# Patient Record
Sex: Male | Born: 1945 | Race: White | Hispanic: No | Smoking: Never smoker
Health system: Southern US, Community
[De-identification: ages and names within clinical notes are randomized; demographics above are authoritative.]

## PROBLEM LIST (undated history)

## (undated) DIAGNOSIS — E785 Hyperlipidemia, unspecified: Secondary | ICD-10-CM

## (undated) DIAGNOSIS — R7301 Impaired fasting glucose: Secondary | ICD-10-CM

## (undated) DIAGNOSIS — B159 Hepatitis A without hepatic coma: Secondary | ICD-10-CM

## (undated) DIAGNOSIS — I714 Abdominal aortic aneurysm, without rupture, unspecified: Secondary | ICD-10-CM

## (undated) DIAGNOSIS — I10 Essential (primary) hypertension: Secondary | ICD-10-CM

## (undated) HISTORY — DX: Abdominal aortic aneurysm, without rupture: I71.4

## (undated) HISTORY — DX: Hyperlipidemia, unspecified: E78.5

## (undated) HISTORY — DX: Hepatitis a without hepatic coma: B15.9

## (undated) HISTORY — DX: Essential (primary) hypertension: I10

## (undated) HISTORY — DX: Abdominal aortic aneurysm, without rupture, unspecified: I71.40

## (undated) HISTORY — DX: Impaired fasting glucose: R73.01

---

## 1980-04-03 HISTORY — PX: OTHER SURGICAL HISTORY: SHX169

## 1996-04-03 DIAGNOSIS — B159 Hepatitis A without hepatic coma: Secondary | ICD-10-CM

## 1996-04-03 HISTORY — DX: Hepatitis a without hepatic coma: B15.9

## 2006-02-21 ENCOUNTER — Ambulatory Visit: Payer: Self-pay | Admitting: Family Medicine

## 2006-02-21 DIAGNOSIS — E785 Hyperlipidemia, unspecified: Secondary | ICD-10-CM

## 2006-02-21 DIAGNOSIS — I10 Essential (primary) hypertension: Secondary | ICD-10-CM | POA: Insufficient documentation

## 2006-07-20 ENCOUNTER — Ambulatory Visit: Payer: Self-pay | Admitting: Family Medicine

## 2006-07-23 ENCOUNTER — Encounter: Payer: Self-pay | Admitting: Family Medicine

## 2006-07-23 DIAGNOSIS — R972 Elevated prostate specific antigen [PSA]: Secondary | ICD-10-CM | POA: Insufficient documentation

## 2006-07-23 LAB — CONVERTED CEMR LAB
ALT: 12 units/L (ref 0–53)
Albumin: 4.3 g/dL (ref 3.5–5.2)
BUN: 20 mg/dL (ref 6–23)
Creatinine, Ser: 0.96 mg/dL (ref 0.40–1.50)
Glucose, Bld: 101 mg/dL — ABNORMAL HIGH (ref 70–99)
HDL: 36 mg/dL — ABNORMAL LOW (ref >39)
Potassium: 4.3 meq/L (ref 3.5–5.3)
Sodium: 139 meq/L (ref 135–145)
Total Bilirubin: 0.6 mg/dL (ref 0.3–1.2)
Total CHOL/HDL Ratio: 3.8
Total Protein: 7.2 g/dL (ref 6.0–8.3)
VLDL: 17 mg/dL (ref 0–40)

## 2006-07-26 ENCOUNTER — Encounter: Payer: Self-pay | Admitting: Family Medicine

## 2007-01-23 ENCOUNTER — Ambulatory Visit: Payer: Self-pay | Admitting: Family Medicine

## 2007-02-08 ENCOUNTER — Ambulatory Visit: Payer: Self-pay | Admitting: Family Medicine

## 2007-02-12 ENCOUNTER — Telehealth: Payer: Self-pay | Admitting: Family Medicine

## 2007-02-13 ENCOUNTER — Encounter: Payer: Self-pay | Admitting: Family Medicine

## 2007-03-14 ENCOUNTER — Encounter: Payer: Self-pay | Admitting: Family Medicine

## 2007-03-14 LAB — HM COLONOSCOPY

## 2007-10-31 ENCOUNTER — Telehealth: Payer: Self-pay | Admitting: Family Medicine

## 2007-11-01 ENCOUNTER — Encounter: Payer: Self-pay | Admitting: Family Medicine

## 2007-11-01 ENCOUNTER — Ambulatory Visit: Payer: Self-pay | Admitting: Family Medicine

## 2007-11-01 DIAGNOSIS — I440 Atrioventricular block, first degree: Secondary | ICD-10-CM

## 2007-11-04 LAB — CONVERTED CEMR LAB
ALT: 15 units/L (ref 0–53)
BUN: 18 mg/dL (ref 6–23)
CO2: 25 meq/L (ref 19–32)
Calcium: 9.9 mg/dL (ref 8.4–10.5)
Chloride: 103 meq/L (ref 96–112)
Cholesterol: 156 mg/dL (ref 0–200)
Glucose, Bld: 106 mg/dL — ABNORMAL HIGH (ref 70–99)
LDL Cholesterol: 95 mg/dL (ref 0–99)
PSA: 1.16 ng/mL (ref 0.10–4.00)
Potassium: 4.7 meq/L (ref 3.5–5.3)
Sodium: 139 meq/L (ref 135–145)

## 2007-11-27 ENCOUNTER — Ambulatory Visit: Payer: Self-pay | Admitting: Cardiology

## 2007-11-29 ENCOUNTER — Ambulatory Visit: Payer: Self-pay | Admitting: Family Medicine

## 2007-11-29 DIAGNOSIS — R7301 Impaired fasting glucose: Secondary | ICD-10-CM | POA: Insufficient documentation

## 2007-12-02 LAB — CONVERTED CEMR LAB
ALT: 20 units/L (ref 0–53)
Albumin: 4.9 g/dL (ref 3.5–5.2)
Indirect Bilirubin: 0.8 mg/dL (ref 0.0–0.9)
Total Bilirubin: 1 mg/dL (ref 0.3–1.2)
Total Protein: 8.2 g/dL (ref 6.0–8.3)

## 2007-12-20 ENCOUNTER — Ambulatory Visit: Payer: Self-pay | Admitting: Cardiology

## 2007-12-20 ENCOUNTER — Ambulatory Visit: Payer: Self-pay

## 2008-01-24 ENCOUNTER — Ambulatory Visit: Payer: Self-pay | Admitting: Family Medicine

## 2008-01-24 LAB — CONVERTED CEMR LAB: Blood Glucose, Fasting: 104 mg/dL

## 2008-04-16 ENCOUNTER — Ambulatory Visit: Payer: Self-pay | Admitting: Family Medicine

## 2008-04-16 LAB — CONVERTED CEMR LAB
Ketones, urine, test strip: NEGATIVE
Nitrite: NEGATIVE
Protein, U semiquant: NEGATIVE
Specific Gravity, Urine: 1.01
WBC Urine, dipstick: NEGATIVE

## 2008-10-08 ENCOUNTER — Ambulatory Visit: Payer: Self-pay | Admitting: Family Medicine

## 2008-10-08 LAB — CONVERTED CEMR LAB: Blood Glucose, AC Bkfst: 110 mg/dL

## 2008-10-09 ENCOUNTER — Encounter: Payer: Self-pay | Admitting: Family Medicine

## 2008-10-12 ENCOUNTER — Encounter: Payer: Self-pay | Admitting: Family Medicine

## 2008-11-13 ENCOUNTER — Encounter: Payer: Self-pay | Admitting: Family Medicine

## 2008-11-16 LAB — CONVERTED CEMR LAB
Alkaline Phosphatase: 51 units/L (ref 39–117)
BUN: 17 mg/dL (ref 6–23)
CO2: 23 meq/L (ref 19–32)
Calcium: 9.5 mg/dL (ref 8.4–10.5)
Cholesterol: 142 mg/dL (ref 0–200)
Glucose, Bld: 102 mg/dL — ABNORMAL HIGH (ref 70–99)
Total Bilirubin: 0.7 mg/dL (ref 0.3–1.2)
Total CHOL/HDL Ratio: 3
Total Protein: 7.4 g/dL (ref 6.0–8.3)
Triglycerides: 58 mg/dL (ref <150)

## 2009-07-28 ENCOUNTER — Ambulatory Visit: Payer: Self-pay | Admitting: Family Medicine

## 2009-07-28 DIAGNOSIS — M79609 Pain in unspecified limb: Secondary | ICD-10-CM

## 2009-07-28 DIAGNOSIS — S6990XA Unspecified injury of unspecified wrist, hand and finger(s), initial encounter: Secondary | ICD-10-CM

## 2009-07-28 DIAGNOSIS — S6980XA Other specified injuries of unspecified wrist, hand and finger(s), initial encounter: Secondary | ICD-10-CM

## 2009-10-02 ENCOUNTER — Ambulatory Visit: Payer: Self-pay | Admitting: Family Medicine

## 2009-10-02 DIAGNOSIS — T6391XA Toxic effect of contact with unspecified venomous animal, accidental (unintentional), initial encounter: Secondary | ICD-10-CM | POA: Insufficient documentation

## 2009-12-17 ENCOUNTER — Ambulatory Visit: Payer: Self-pay | Admitting: Family Medicine

## 2009-12-20 LAB — CONVERTED CEMR LAB
ALT: 18 units/L (ref 0–53)
AST: 57 units/L — ABNORMAL HIGH (ref 0–37)
Alkaline Phosphatase: 58 units/L (ref 39–117)
BUN: 11 mg/dL (ref 6–23)
Creatinine, Ser: 0.88 mg/dL (ref 0.40–1.50)
LDL Cholesterol: 95 mg/dL (ref 0–99)
PSA, Free: 0.3 ng/mL
Potassium: 4.1 meq/L (ref 3.5–5.3)
Sodium: 136 meq/L (ref 135–145)
Total Bilirubin: 0.9 mg/dL (ref 0.3–1.2)
Total CHOL/HDL Ratio: 3.5
Triglycerides: 63 mg/dL (ref <150)
VLDL: 13 mg/dL (ref 0–40)

## 2010-01-27 ENCOUNTER — Telehealth (INDEPENDENT_AMBULATORY_CARE_PROVIDER_SITE_OTHER): Payer: Self-pay | Admitting: *Deleted

## 2010-03-02 ENCOUNTER — Telehealth (INDEPENDENT_AMBULATORY_CARE_PROVIDER_SITE_OTHER): Payer: Self-pay | Admitting: *Deleted

## 2010-04-08 ENCOUNTER — Ambulatory Visit
Admission: RE | Admit: 2010-04-08 | Discharge: 2010-04-08 | Payer: Self-pay | Source: Home / Self Care | Attending: Family Medicine | Admitting: Family Medicine

## 2010-05-03 NOTE — Progress Notes (Signed)
Summary: PSA Dx code  Phone Note Call from Patient Call back at [phone]   Caller: Patient Call For: Brad Bars DO Summary of Call: Pt calls and states that he spoke with [facility] and they never received the new diagnosis code for his PSA test that was done. Please call him back at above number. I looked at looks like you faxed it but... Initial call taken by: Kathlene November LPN,  March 02, 2010 12:57 PM  Follow-up for Phone Call        Spoke w/ Irma at lab. She states that she will change CPT code and resubmit to insurance. She states that it was accepted through their system so she didnt see a problem in the future. Tried to call Pt at above # but did not get an answer or VM Follow-up by: Payton Spark CMA,  March 03, 2010 10:13 AM  Additional Follow-up for Phone Call Additional follow up Details #1::        Wife aware of the above Additional Follow-up by: Payton Spark CMA,  March 03, 2010 3:27 PM

## 2010-05-03 NOTE — Assessment & Plan Note (Signed)
Summary: PT FELL: HIT HEAD, LEFT THUMB-SWOLLEN & BRUISED/TJ   Vital Signs:  Patient Profile:   64 Years Old Male CC:      L Thumb pain x today Height:     66 inches Weight:      166 pounds O2 Sat:      100 % O2 treatment:    Room Air Temp:     97.0 degrees F oral Pulse rate:   76 / minute Pulse rhythm:   regular Resp:     18 per minute BP sitting:   109 / 75  (right arm) Cuff size:   regular  Vitals Entered By: Areta Haber CMA (July 28, 2009 5:47 PM)                  Prior Medication List:  LOVASTATIN 40 MG TABS (LOVASTATIN) Take 1 tablet by mouth once a day ASPIRIN 81 MG TBEC (ASPIRIN) Take 1 tablet by mouth once a day LISINOPRIL-HYDROCHLOROTHIAZIDE 20-25 MG TABS (LISINOPRIL-HYDROCHLOROTHIAZIDE) 1 tab by mouth daily SILVADENE 1 % CREA (SILVER SULFADIAZINE) apply two times a day as needed to burn area * ZOSTAVAX admister x 1  Age > 60   Current Allergies: No known allergies History of Present Illness Chief Complaint: L Thumb pain x today History of Present Illness: Patient  has hit his thumb after falling. He has a goose egg  on his forehead after falling. He hurt his L thumb as well. Denies any LOC but after several hours the L thumb paimn progressivelly got worse.  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) AV BLOCK, 1ST DEGREE (ICD-426.11) PREVENTIVE HEALTH CARE (ICD-V70.0) ELEVATED PROSTATE SPECIFIC ANTIGEN (ICD-790.93) HYPERLIPIDEMIA NEC/NOS (ICD-272.4) HYPERTENSION, BENIGN ESSENTIAL (ICD-401.1)   Current Meds LOVASTATIN 40 MG TABS (LOVASTATIN) Take 1 tablet by mouth once a day ASPIRIN 81 MG TBEC (ASPIRIN) Take 1 tablet by mouth once a day LISINOPRIL-HYDROCHLOROTHIAZIDE 20-25 MG TABS (LISINOPRIL-HYDROCHLOROTHIAZIDE) 1 tab by mouth daily MOBIC 7.5 MG TABS (MELOXICAM) sig 1 by mouth q day HYDROCODONE-ACETAMINOPHEN 5-325 MG TABS  (HYDROCODONE-ACETAMINOPHEN) sig 1 by mouth q 6-8hrs as needed for pain  REVIEW OF SYSTEMS Constitutional Symptoms      Denies fever, chills, night sweats, weight loss, weight gain, and fatigue.  Eyes       Denies change in vision, eye pain, eye discharge, glasses, contact lenses, and eye surgery. Ear/Nose/Throat/Mouth       Denies hearing loss/aids, change in hearing, ear pain, ear discharge, dizziness, frequent runny nose, frequent nose bleeds, sinus problems, sore throat, hoarseness, and tooth pain or bleeding.  Respiratory       Denies dry cough, productive cough, wheezing, shortness of breath, asthma, bronchitis, and emphysema/COPD.  Cardiovascular       Denies murmurs, chest pain, and tires easily with exhertion.    Gastrointestinal       Denies stomach pain, nausea/vomiting, diarrhea, constipation, blood in bowel movements, and indigestion. Genitourniary       Denies painful urination, kidney stones, and loss of urinary control. Neurological       Denies paralysis, seizures, and fainting/blackouts. Musculoskeletal       Complains of decreased range of motion, redness, and swelling.      Denies muscle pain, joint pain, joint stiffness, muscle weakness, and gout.      Comments: L thumb x today Skin       Denies bruising, unusual mles/lumps or sores, and hair/skin or nail changes.  Psych       Denies mood changes, temper/anger issues, anxiety/stress, speech problems, depression, and sleep problems. Other Comments: Pt states he was going up stairs and fell, hitting his head and landed on L hand. Pt states his L thumb is hurting.   Past History:  Past Medical History: Last updated: 10/08/2008 Hepatitis A 1998 with elevated LFTs high cholestrol HTN IFG  Stress test normal 09 Colonoscopy normal 08  Past Surgical History: Last updated: 02/21/2006 L knee arthroscopic surgery 1982  Family History: Last updated: 12-Feb-2008 father died 55s of heart dz mother HTN, high chol,  died 62.  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  2 grown children, 4 grandkids.  Nonsmoker.  Walks 45 min a day. Denies ETOH.  Eats healthy. Never Smoked Drug use-yes Regular exercise-yes  Risk Factors: Alcohol Use: <1 (11/01/2007) Caffeine Use: 0 (11/01/2007) Exercise: yes (11/01/2007)  Risk Factors: Smoking Status: never (11/01/2007) Passive Smoke Exposure: no (11/01/2007)  Family History: Reviewed history from 02-12-2008 and no changes required. father died 57s of heart dz mother HTN, high chol, died 59.  sisters and brother HTN, high chol  no fam hx of type II DM  Social History: Reviewed history from 11/01/2007 and no changes required. 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  2 grown children, 4 grandkids.  Nonsmoker.  Walks 45 min a day. Denies ETOH.  Eats healthy. Never Smoked Drug use-yes Regular exercise-yes Physical Exam General appearance: well developed, well nourished, no acute distress Head: R goose egg  Eyes: conjunctivae and lids normal Pupils: equal, round, reactive to light Neck: neck supple,  trachea midline, no masses Extremities: L thumb swollen proxmal phallenx no tenderness over the snuff box Skin: no obvious rashes or lesions MSE: oriented to time, place, and person Assessment New Problems: INJURY OTHER AND UNSPECIFIED FINGER (ICD-959.5) THUMB PAIN, LEFT (ICD-729.5) MUSCULOSKELETAL PAIN (ICD-781.99)  contusion of L thumb  Patient Education: Patient and/or caregiver instructed in the following: rest fluids and Tylenol.  Plan New Medications/Changes: HYDROCODONE-ACETAMINOPHEN 5-325 MG TABS (HYDROCODONE-ACETAMINOPHEN) sig 1 by mouth q 6-8hrs as needed for pain  #16 x 0, 07/28/2009, Hassan Rowan MD MOBIC 7.5 MG TABS (MELOXICAM) sig 1 by mouth q day  #30 x 0, 07/28/2009, Hassan Rowan MD  New Orders: New Patient Level  III [[address]] T-DG Finger Thumb*L* [73140] Planning Comments:   need to follow up w/PCP  Follow Up: Follow up on an as needed basis, Follow up with Primary Physician  The patient and/or caregiver has been counseled thoroughly with regard to medications prescribed including dosage, schedule, interactions, rationale for use, and possible side effects and they verbalize understanding.  Diagnoses and expected course of recovery discussed and will return if not improved as expected or if the condition worsens. Patient and/or caregiver verbalized understanding.  Prescriptions: HYDROCODONE-ACETAMINOPHEN 5-325 MG TABS (HYDROCODONE-ACETAMINOPHEN) sig 1 by mouth q 6-8hrs as needed for pain  #16 x 0   Entered and Authorized by:   Hassan Rowan MD   Signed by:   Hassan Rowan MD on 07/28/2009   Method used:   Printed then faxed to ...       [facility] (retail)       [address]       [place], [place]  [address]       Ph: [phone]       Fax: [phone]   RxID:  [id number] MOBIC 7.5 MG TABS (MELOXICAM) sig 1 by mouth q day  #30 x 0   Entered and Authorized by:   Hassan Rowan MD   Signed by:   Hassan Rowan MD on 07/28/2009   Method used:   Printed then faxed to ...       [facility] (retail)       [address]       [place], [place]  [address]       Ph: [phone]       Fax: [phone]   RxID:   [id number]   Patient Instructions: 1)  Please schedule an appointment with your primary doctor in : 2)  Please schedule a follow-up appointment in 1- 2 weeks. 3)  Recommended remaining out of work until 07/30/2009 4)  use the splint for the thumb if it helps 5)  ice the thumb 20 minutes out of 2 hours as much as possible next 48 hours

## 2010-05-03 NOTE — Progress Notes (Signed)
Summary: Lab diagnosis  Phone Note Call from Patient Call back at Home Phone [phone]   Caller: Patient Call For: Seymour Bars DO Summary of Call: Pt called and LM for a return call in regards to insurance billing.  Called pt @ 2:04pm- LMOM to return call. KJ LPN-- Got a bill from [facility] on PSA test- they are not covering this test because of diagnosis code that was submitted. 401.1  272.4  V76.44 Initial call taken by: Kathlene November LPN,  January 27, 2010 2:05 PM  Follow-up for Phone Call        Pleas see printed new order. Let resubmit adn see if covered. Just changed to the medicare cod.  Follow-up by: Nani Gasser MD,  January 27, 2010 5:41 PM  Additional Follow-up for Phone Call Additional follow up Details #1::        Faxed to lab Additional Follow-up by: Payton Spark CMA,  January 28, 2010 8:24 AM

## 2010-05-03 NOTE — Assessment & Plan Note (Signed)
Summary: CPE   Vital Signs:  Patient profile:   65 year old male Height:      66 inches Weight:      160 pounds BMI:     25.92 O2 Sat:      100 % on Room air Pulse rate:   67 / minute BP sitting:   120 / 82  (left arm) Cuff size:   regular  Vitals Entered By: Payton Spark CMA (December 17, 2009 9:03 AM)  O2 Flow:  Room air CC: CPE and fasting labs   Primary Care Kellyn Mccary:  Seymour Bars DO  CC:  CPE and fasting labs.  History of Present Illness: 65 yo WM presents for CPE.  He has hx HTN, hyperlipidemia and IFG.   He is due for fasting labs.  He had a normal exercise treadmill test with Dr Jens Som 9-09.  Had a normal colonoscopy in 08 and a TDap updated in 07.  Getting flu shot at work.    His brother died recently in 3 wks from CHF and mulitple myeloma.  He was 74.      Current Medications (verified): 1)  Lovastatin 40 Mg Tabs (Lovastatin) .... Take 1 Tablet By Mouth Once A Day 2)  Aspirin 81 Mg Tbec (Aspirin) .... Take 1 Tablet By Mouth Once A Day 3)  Lisinopril-Hydrochlorothiazide 20-25 Mg Tabs (Lisinopril-Hydrochlorothiazide) .Marland Kitchen.. 1 Tab By Mouth Daily  Allergies (verified): No Known Drug Allergies  Past History:  Past Medical History: Reviewed history from 10/08/2008 and no changes required. Hepatitis A 1998 with elevated LFTs high cholestrol HTN IFG  Stress test normal 09 Colonoscopy normal 08  Past Surgical History: Reviewed history from 02/21/2006 and no changes required. L knee arthroscopic surgery 1982  Family History: Reviewed history from 01/24/2008 and no changes required. father died 56s of heart dz mother HTN, high chol, died 61.  sisters and brother HTN, high chol  no fam hx of type II DM  Social History: Reviewed history from 11/01/2007 and no changes required. 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  2 grown children, 4 grandkids.  Nonsmoker.  Walks 45 min a day. Denies ETOH.  Eats healthy. Never Smoked Drug  use-yes Regular exercise-yes  Review of Systems  The patient denies anorexia, fever, weight loss, weight gain, vision loss, decreased hearing, hoarseness, chest pain, syncope, dyspnea on exertion, peripheral edema, prolonged cough, headaches, hemoptysis, abdominal pain, melena, hematochezia, severe indigestion/heartburn, hematuria, incontinence, genital sores, muscle weakness, suspicious skin lesions, transient blindness, difficulty walking, depression, unusual weight change, abnormal bleeding, enlarged lymph nodes, angioedema, breast masses, and testicular masses.    Physical Exam  General:  alert, well-developed, well-nourished, and well-hydrated.   Head:  normocephalic and atraumatic.   Eyes:  pupils equal, pupils round, and pupils reactive to light.  wears glasses Ears:  EACs patent; TMs translucent and gray with good cone of light and bony landmarks.  Nose:  no nasal discharge.   Mouth:  good dentition and pharynx pink and moist.   Neck:  no masses.  no audible carotid bruits Lungs:  Normal respiratory effort, chest expands symmetrically. Lungs are clear to auscultation, no crackles or wheezes. Heart:  Normal rate and regular rhythm. S1 and S2 normal without gallop, murmur, click, rub or other extra sounds. Abdomen:  no AA bruits, soft, non-tender, normal bowel sounds, no distention, no masses, no guarding, no hepatomegaly, and no splenomegaly.   Rectal:  No external abnormalities noted. Normal sphincter tone. No rectal masses or tenderness. hemoccult neg  Prostate:  Prostate gland firm and smooth, no enlargement, nodularity, tenderness, mass, asymmetry or induration. Pulses:  2+ radial and pedal pulses Extremities:  no E/C/C Neurologic:  gait normal.   Skin:  color normal.   Cervical Nodes:  No lymphadenopathy noted Psych:  good eye contact, not anxious appearing, and not depressed appearing.     Impression & Recommendations:  Problem # 1:  PHYSICAL EXAMINATION  (ICD-V70.0) Keeping healthy checklist for men reviewed. BP excellent.  Continue current anti hypertensive. Update fasting labs today -- recheck lipids (on statin) and fasting sugar (hx of IFG). BMI 25= normal. TD UTD, getting flu shot at work. Due for PNX at 64. ETT done 9-09, normal.  Asymptomatic.  REpeat next yr. Colonscopy normal 08. DRE today; PSA today for prostate cancer screening.  Complete Medication List: 1)  Lovastatin 40 Mg Tabs (Lovastatin) .... Take 1 tablet by mouth once a day 2)  Aspirin 81 Mg Tbec (Aspirin) .... Take 1 tablet by mouth once a day 3)  Lisinopril-hydrochlorothiazide 20-25 Mg Tabs (Lisinopril-hydrochlorothiazide) .Marland Kitchen.. 1 tab by mouth daily  Other Orders: T-Comprehensive Metabolic Panel [id number]) T-Lipid Profile [id number]) T-PSA Total ([id number]) Hemoccult Guaiac-1 spec.(in office) ([address])  Patient Instructions: 1)  Stay on current meds. 2)  Update labs today. 3)  Will call you w/ results Monday. 4)  Return for f/u BP/ sugar in 4 mos. Prescriptions: LISINOPRIL-HYDROCHLOROTHIAZIDE 20-25 MG TABS (LISINOPRIL-HYDROCHLOROTHIAZIDE) 1 tab by mouth daily  #90 x 3   Entered by:   Payton Spark CMA   Authorized by:   Seymour Bars DO   Signed by:   Seymour Bars DO on 12/17/2009   Method used:   Electronically to        [facility] (retail)             ,          Ph: [phone]       Fax: [phone]   RxID:   [id number] LOVASTATIN 40 MG TABS (LOVASTATIN) Take 1 tablet by mouth once a day  #90 x 3   Entered by:   Payton Spark CMA   Authorized by:   Seymour Bars DO   Signed by:   Seymour Bars DO on 12/17/2009   Method used:   Electronically to        [facility] (retail)             ,          Ph: [phone]       Fax: [phone]   RxID:   [id number]

## 2010-05-03 NOTE — Assessment & Plan Note (Signed)
Summary: R eye pain- stung by yellow jacket x 2 dys rm 3   Vital Signs:  Patient Profile:   65 Years Old Male CC:      R eye pain/stung by yellow jacket x 2 dys Height:     66 inches Weight:      162 pounds O2 Sat:      100 % O2 treatment:    Room Air Temp:     97.6 degrees F oral Pulse rate:   83 / minute Pulse rhythm:   regular Resp:     16 per minute BP sitting:   100 / 68  (right arm) Cuff size:   regular  Vitals Entered By: Areta Haber CMA (October 02, 2009 1:12 PM)                  Current Allergies: No known allergies History of Present Illness Chief Complaint: R eye pain/stung by yellow jacket x 2 dys History of Present Illness:  Subjective:  Patient complains of insect sting to his right face just below eye two days ago.  He has had a gradual increase in swelling around the eye, and there has been persistent itching.  The area has become more sensitive over the past 24 hours.  No change in vision.  Current Meds LOVASTATIN 40 MG TABS (LOVASTATIN) Take 1 tablet by mouth once a day ASPIRIN 81 MG TBEC (ASPIRIN) Take 1 tablet by mouth once a day LISINOPRIL-HYDROCHLOROTHIAZIDE 20-25 MG TABS (LISINOPRIL-HYDROCHLOROTHIAZIDE) 1 tab by mouth daily BENADRYL 50 MG/ML SOLN (DIPHENHYDRAMINE HCL) as directed CEPHALEXIN 500 MG CAPS (CEPHALEXIN) One by mouth two times a day PREDNISONE 10 MG TABS (PREDNISONE) 2 PO BID for 2 days, then 1 BID for 2 days, then 1 daily for 2 days.  Take PC  REVIEW OF SYSTEMS Constitutional Symptoms      Denies fever, chills, night sweats, weight loss, weight gain, and fatigue.  Eyes       Complains of eye pain.      Denies change in vision, eye discharge, glasses, contact lenses, and eye surgery.      Comments: R-stung by yellow jacket x 2 dys Ear/Nose/Throat/Mouth       Denies hearing loss/aids, change in hearing, ear pain, ear discharge, dizziness, frequent runny nose, frequent nose bleeds, sinus problems, sore throat, hoarseness, and tooth  pain or bleeding.  Respiratory       Denies dry cough, productive cough, wheezing, shortness of breath, asthma, bronchitis, and emphysema/COPD.  Cardiovascular       Denies murmurs, chest pain, and tires easily with exhertion.    Gastrointestinal       Denies stomach pain, nausea/vomiting, diarrhea, constipation, blood in bowel movements, and indigestion. Genitourniary       Denies painful urination, kidney stones, and loss of urinary control. Neurological       Denies paralysis, seizures, and fainting/blackouts. Musculoskeletal       Denies muscle pain, joint pain, joint stiffness, decreased range of motion, redness, swelling, muscle weakness, and gout.  Skin       Denies bruising, unusual mles/lumps or sores, and hair/skin or nail changes.  Psych       Denies mood changes, temper/anger issues, anxiety/stress, speech problems, depression, and sleep problems. Other Comments: Pt has not seen PCP for this.    Objective:  No acute distress  Eyes:  Pupils are equal, round, and reactive to light and accomdation.  Extraocular movement is intact.  Conjunctivae are not inflamed.  There is mild edema and erythema of the right upper and lower lids, but no tenderness to palpation.  No drainage from the right eye. 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 EARLY CELLULITIS  Plan New Medications/Changes: PREDNISONE 10 MG TABS (PREDNISONE) 2 PO BID for 2 days, then 1 BID for 2 days, then 1 daily for 2 days.  Take PC  #14 x 0, 10/02/2009, Donna Christen MD CEPHALEXIN 500 MG CAPS (CEPHALEXIN) One by mouth two times a day  #14 x 0, 10/02/2009, Donna Christen MD  New Orders: Est. Patient Level III [phone] Planning Comments:   Tapering course of prednisone.  Begin Keflex.  Apply cool compresses.  Take Benadryl 50mg  hs  Follow-up with ophthalmologist if not improving 3 to 4 days, or if symptoms worsen.   The patient and/or caregiver has been counseled thoroughly with regard to  medications prescribed including dosage, schedule, interactions, rationale for use, and possible side effects and they verbalize understanding.  Diagnoses and expected course of recovery discussed and will return if not improved as expected or if the condition worsens. Patient and/or caregiver verbalized understanding.  Prescriptions: PREDNISONE 10 MG TABS (PREDNISONE) 2 PO BID for 2 days, then 1 BID for 2 days, then 1 daily for 2 days.  Take PC  #14 x 0   Entered and Authorized by:   Donna Christen MD   Signed by:   Donna Christen MD on 10/02/2009   Method used:   Print then Give to Patient   RxID:   [id number] CEPHALEXIN 500 MG CAPS (CEPHALEXIN) One by mouth two times a day  #14 x 0   Entered and Authorized by:   Donna Christen MD   Signed by:   Donna Christen MD on 10/02/2009   Method used:   Print then Give to Patient   RxID:   [id number]   Orders Added: 1)  Est. Patient Level III [29528]

## 2010-05-03 NOTE — Letter (Signed)
Summary: Out of Work  [facility]  [address]   [place], [place] [address]   Phone: [phone]  Fax: [phone]    July 28, 2009   Employee:  BALDEMAR DADY [employer]    To Whom It May Concern:   For Medical reasons, please excuse the above named employee from work for the following dates:  Start:   07/28/2009  Return:   07/30/2009  If you need additional information, please feel free to contact our office.         Sincerely,    Hassan Rowan MD

## 2010-05-05 NOTE — Assessment & Plan Note (Signed)
Summary: sinusitis   Vital Signs:  Patient profile:   65 year old male Height:      66 inches Weight:      168 pounds BMI:     27.21 O2 Sat:      96 % on Room air Temp:     97.4 degrees F oral Pulse rate:   85 / minute BP sitting:   129 / 85  (left arm) Cuff size:   regular  Vitals Entered By: Payton Spark CMA (April 08, 2010 11:32 AM)  O2 Flow:  Room air CC: HA, cough and sinus pressure x weeks. OTC meds not helping   Primary Care Provider:  Seymour Bars DO  CC:  HA and cough and sinus pressure x weeks. OTC meds not helping.  History of Present Illness: 65 yo WM presents for head congestion, cough which started as a cold before the Holidays.  He seemed to get better 3-4 days but now feels worse again.  He has tried [place] HBP and nasal saline which is not helping.  Not taking anyhting for the HA.  No sore throat or ear pain.  Has had subjective fevers.      Current Medications (verified): 1)  Lovastatin 40 Mg Tabs (Lovastatin) .... Take 1 Tablet By Mouth Once A Day 2)  Aspirin 81 Mg Tbec (Aspirin) .... Take 1 Tablet By Mouth Once A Day 3)  Lisinopril-Hydrochlorothiazide 20-25 Mg Tabs (Lisinopril-Hydrochlorothiazide) .Marland Kitchen.. 1 Tab By Mouth Daily  Allergies (verified): No Known Drug Allergies  Past History:  Past Medical History: Reviewed history from 10/08/2008 and no changes required. Hepatitis A 1998 with elevated LFTs high cholestrol HTN IFG  Stress test normal 09 Colonoscopy normal 08  Social History: Reviewed history from 11/01/2007 and no changes required. 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  2 grown children, 4 grandkids.  Nonsmoker.  Walks 45 min a day. Denies ETOH.  Eats healthy. Never Smoked Drug use-yes Regular exercise-yes  Review of Systems      See HPI  Physical Exam  General:  alert, well-developed, well-nourished, and well-hydrated.   Head:  normocephalic and atraumatic.  frontal sinus TTP Eyes:  conjuntiva clear; wears  glasses Ears:  EACs patent; TMs translucent and gray with good cone of light and bony landmarks.  Nose:  nasal congestion present Mouth:  pharynx pink and moist.   Neck:  no masses.   Lungs:  Normal respiratory effort, chest expands symmetrically. Lungs are clear to auscultation, no crackles or wheezes. Heart:  Normal rate and regular rhythm. S1 and S2 normal without gallop, murmur, click, rub or other extra sounds. Skin:  color normal.   Cervical Nodes:  No lymphadenopathy noted   Impression & Recommendations:  Problem # 1:  ACUTE SINUSITIS, UNSPECIFIED (ICD-461.9) Treat with Amoxicillin + Flonase.  OK to use plain Mucinex and Ibuprofen for adjunctive care. Call if not improved after 10 days. His updated medication list for this problem includes:    Amoxicillin 500 Mg Caps (Amoxicillin) .Marland Kitchen... 1 capsule by mouth three times a day x 10 days    Fluticasone Propionate 50 Mcg/act Susp (Fluticasone propionate) .Marland Kitchen... 2 sprays/ nostril once daily  Complete Medication List: 1)  Lovastatin 40 Mg Tabs (Lovastatin) .... Take 1 tablet by mouth once a day 2)  Aspirin 81 Mg Tbec (Aspirin) .... Take 1 tablet by mouth once a day 3)  Lisinopril-hydrochlorothiazide 20-25 Mg Tabs (Lisinopril-hydrochlorothiazide) .Marland Kitchen.. 1 tab by mouth daily 4)  Amoxicillin 500 Mg Caps (Amoxicillin) .Marland KitchenMarland KitchenMarland Kitchen  1 capsule by mouth three times a day x 10 days 5)  Fluticasone Propionate 50 Mcg/act Susp (Fluticasone propionate) .... 2 sprays/ nostril once daily  Patient Instructions: 1)  For sinusitis: 2)  Take Amoxicillin 3 x a day for 10 days (with food). 3)  Use Flonase once daily until bottle is complete. 4)  OK to use Ibuprofen  up to 3 tabs 3 x a day with food for headache and plain Mucinex (to thin out mucous). 5)  Call if not improved after 10 days. Prescriptions: FLUTICASONE PROPIONATE 50 MCG/ACT SUSP (FLUTICASONE PROPIONATE) 2 sprays/ nostril once daily  #1 bottle x 0   Entered and Authorized by:   Seymour Bars DO    Signed by:   Seymour Bars DO on 04/08/2010   Method used:   Electronically to        [facility] (retail)       [address]       [place], [place]  [address]       Ph: [phone]       Fax: [phone]   RxID:   [id number] AMOXICILLIN 500 MG CAPS (AMOXICILLIN) 1 capsule by mouth three times a day x 10 days  #30 x 0   Entered and Authorized by:   Seymour Bars DO   Signed by:   Seymour Bars DO on 04/08/2010   Method used:   Electronically to        [facility] (retail)       [address]       [place], [place]  [address]       Ph: [phone]       Fax: [phone]   RxID:   [id number]    Orders Added: 1)  Est. Patient Level III [06301]

## 2010-05-11 ENCOUNTER — Encounter: Payer: Self-pay | Admitting: Family Medicine

## 2010-05-11 ENCOUNTER — Ambulatory Visit: Payer: Self-pay | Admitting: Family Medicine

## 2010-05-11 ENCOUNTER — Ambulatory Visit (INDEPENDENT_AMBULATORY_CARE_PROVIDER_SITE_OTHER): Payer: BC Managed Care – PPO | Admitting: Family Medicine

## 2010-05-11 DIAGNOSIS — M25519 Pain in unspecified shoulder: Secondary | ICD-10-CM

## 2010-05-11 DIAGNOSIS — IMO0002 Reserved for concepts with insufficient information to code with codable children: Secondary | ICD-10-CM

## 2010-05-18 ENCOUNTER — Ambulatory Visit (INDEPENDENT_AMBULATORY_CARE_PROVIDER_SITE_OTHER): Payer: BC Managed Care – PPO | Admitting: Family Medicine

## 2010-05-18 ENCOUNTER — Encounter: Payer: Self-pay | Admitting: Family Medicine

## 2010-05-18 ENCOUNTER — Other Ambulatory Visit: Payer: Self-pay | Admitting: Family Medicine

## 2010-05-18 ENCOUNTER — Ambulatory Visit
Admission: RE | Admit: 2010-05-18 | Discharge: 2010-05-18 | Disposition: A | Discharge status: Home / Self Care | Payer: BC Managed Care – PPO | Source: Ambulatory Visit | Attending: Family Medicine | Admitting: Family Medicine

## 2010-05-18 DIAGNOSIS — M79609 Pain in unspecified limb: Secondary | ICD-10-CM

## 2010-05-18 DIAGNOSIS — M79601 Pain in right arm: Secondary | ICD-10-CM

## 2010-05-18 DIAGNOSIS — M542 Cervicalgia: Secondary | ICD-10-CM | POA: Insufficient documentation

## 2010-05-19 NOTE — Assessment & Plan Note (Signed)
Summary: R shoulder pain/ abrasions   Vital Signs:  Patient profile:   65 year old male Height:      66 inches Weight:      166 pounds BMI:     26.89 O2 Sat:      99 % on Room air Pulse rate:   71 / minute BP sitting:   126 / 80  (left arm) Cuff size:   regular  Vitals Entered By: Payton Spark CMA (May 11, 2010 10:21 AM)  O2 Flow:  Room air CC: Fell in shower on 05/07/2010 injuring R shoulder and R knee. Also has some lacerations on face and head.    Primary Care Provider:  Seymour Bars DO  CC:  Larey Seat in shower on 05/07/2010 injuring R shoulder and R knee. Also has some lacerations on face and head. Marland Kitchen  History of Present Illness: 65 yo WM presents for f/u after a fall on 2-4.  He was up that night after going to a [employer] and eating a hot dog.  Later that night, he ate chips and hot salsa and had a beer and he felt ill that night.  He was laying on the bathroom floor and then realized he had to have a bowel movement.  When he went to get up rapidly, he smashed his head on the wall and then hit his face above his L eyebrow and his nose on the commode.  He thinks that he tried to brace his fall with his R arm and R leg b/c now his R shoulder and R knee hurt.   He denies LOC.  In fact, he did get back up right away to sit on the commode and had a large BM which helped his abd distress.  He realized that his face was bleeding from the facial lacerations but he was able to stop the bleeding in about 30 min with pressure.  He had a HA for 2 days which did improve with taking Advil.  His R shoulder mainly hurts now.  Denies neck pain, confusion or dizziness.      Current Medications (verified): 1)  Lovastatin 40 Mg Tabs (Lovastatin) .... Take 1 Tablet By Mouth Once A Day 2)  Aspirin 81 Mg Tbec (Aspirin) .... Take 1 Tablet By Mouth Once A Day 3)  Lisinopril-Hydrochlorothiazide 20-25 Mg Tabs (Lisinopril-Hydrochlorothiazide) .Marland Kitchen.. 1 Tab By Mouth Daily 4)  Fluticasone Propionate 50  Mcg/act Susp (Fluticasone Propionate) .... 2 Sprays/ Nostril Once Daily  Allergies (verified): No Known Drug Allergies  Past History:  Past Medical History: Reviewed history from 10/08/2008 and no changes required. Hepatitis A 1998 with elevated LFTs high cholestrol HTN IFG  Stress test normal 09 Colonoscopy normal 08  Social History: Reviewed history from 11/01/2007 and no changes required. 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  2 grown children, 4 grandkids.  Nonsmoker.  Walks 45 min a day. Denies ETOH.  Eats healthy. Never Smoked Drug use-yes Regular exercise-yes  Review of Systems      See HPI  Physical Exam  General:  alert, well-developed, well-nourished, and well-hydrated.   Head:  normocephalic ; mild bruising around nose with small lac, healing lac on top of head and over the L eyebrow Eyes:  conjunctiva clear Nose:  no nasal discharge.   Mouth:  pharynx pink and moist.   Neck:  full ROM and no masses.   Lungs:  Normal respiratory effort, chest expands symmetrically. Lungs are clear to auscultation, no crackles or wheezes.  Heart:  Normal rate and regular rhythm. S1 and S2 normal without gallop, murmur, click, rub or other extra sounds. Msk:  full C spine ROM.  full active R glenohumeral ROM, R elbow and R wrist and R knee ROM.  No joint effusions, asymetry, bruising or redness.  + empty can, Hawkins test and apprehension Pulses:  2+ radial pulses Extremities:  no LE or UE edema Neurologic:  grip + 5/5 bilat Psych:  good eye contact, not anxious appearing, and not depressed appearing.     Impression & Recommendations:  Problem # 1:  SHOULDER PAIN, RIGHT (ICD-719.41) Assessment New Exam is c/w R shoulder strain from fall.  Will treat with ice and Celebrex x 10 days.  Call if not improved after 10 days. His updated medication list for this problem includes:    Aspirin 81 Mg Tbec (Aspirin) .Marland Kitchen... Take 1 tablet by mouth once a day    Celebrex 200 Mg  Caps (Celecoxib) .Marland Kitchen... 1 capsule by mouth two times a day with food for shoulder pain  Problem # 2:  ABRASION/FRICION BURN OTH MX&UNS SITE W/O INF (ICD-919.0) Assessment: New Healing.  Clean with soap and water and cover with abx ointment 1-2 x a day. Luckily, no sign of concussion.  Complete Medication List: 1)  Lovastatin 40 Mg Tabs (Lovastatin) .... Take 1 tablet by mouth once a day 2)  Aspirin 81 Mg Tbec (Aspirin) .... Take 1 tablet by mouth once a day 3)  Lisinopril-hydrochlorothiazide 20-25 Mg Tabs (Lisinopril-hydrochlorothiazide) .Marland Kitchen.. 1 tab by mouth daily 4)  Fluticasone Propionate 50 Mcg/act Susp (Fluticasone propionate) .... 2 sprays/ nostril once daily 5)  Celebrex 200 Mg Caps (Celecoxib) .Marland Kitchen.. 1 capsule by mouth two times a day with food for shoulder pain  Patient Instructions: 1)  Start Celebrex 2 x a day - with breakfast and dinner for remaining inflammation in the R shoulder/ head. 2)  Keep wounds on face clean with soap and water and apply antibiotic ointment 1-2 x a day. 3)  Use ice pack on R shoulder x 10 min after work each day. 4)  Call if R shoulder pain is not improved after 10 days.   Orders Added: 1)  Est. Patient Level IV [[address]]

## 2010-05-25 NOTE — Assessment & Plan Note (Signed)
Summary: On-going Arm pain-jr   Vital Signs:  Patient profile:   65 year old male Height:      66 inches Weight:      165 pounds BMI:     26.73 O2 Sat:      96 % on Room air Pulse rate:   75 / minute BP sitting:   118 / 85  (left arm) Cuff size:   large  Vitals Entered By: Payton Spark CMA (May 18, 2010 3:38 PM)  O2 Flow:  Room air CC: R arm pain from elbow to wrist   Primary Care Provider:  Seymour Bars DO  CC:  R arm pain from elbow to wrist.  History of Present Illness: 65 yo WM presents for R forearm pain but no elbow pain.  He has tingling and numbness into the hand.  R shoulder pain has imroved and he is not having neck pain.  This all started after the fall in the [place].  He denies HAs or back pain.  He has some tingling and numbness in the R forearm and hand, radial side w/o weakness.  Able to function well.  Denies pain in the wrist or elbow.      Current Medications (verified): 1)  Lovastatin 40 Mg Tabs (Lovastatin) .... Take 1 Tablet By Mouth Once A Day 2)  Aspirin 81 Mg Tbec (Aspirin) .... Take 1 Tablet By Mouth Once A Day 3)  Lisinopril-Hydrochlorothiazide 20-25 Mg Tabs (Lisinopril-Hydrochlorothiazide) .Marland Kitchen.. 1 Tab By Mouth Daily 4)  Fluticasone Propionate 50 Mcg/act Susp (Fluticasone Propionate) .... 2 Sprays/ Nostril Once Daily 5)  Celebrex 200 Mg Caps (Celecoxib) .Marland Kitchen.. 1 Capsule By Mouth Two Times A Day With Food For Shoulder Pain  Allergies (verified): No Known Drug Allergies  Review of Systems      See HPI  Physical Exam  General:  alert, well-developed, well-nourished, and well-hydrated.   Msk:  C spine limited in full rotation and SB bilat, full flexion, tight trapezious muscles with full glenohumeral, R elbow and wrist ROM w/o pain.  no forearm tenderness to squeeze, bruising, redness or joint effusion. Pulses:  2+ R ulnar and radial pulse Extremities:  no UE edema Neurologic:  grip + 5/5 bilat, sensation intact to light touch.   Skin:   color normal.   Cervical Nodes:  No lymphadenopathy noted   Impression & Recommendations:  Problem # 1:  ARM PAIN, RIGHT (ICD-729.5) By hx and exam, this seems to be a radiculopathy. likely coming from his neck, triggered by inflammation from recent fall.  Celebrex helped but he was walking up at night withpain and tingling.  Stop and replace with Medrol Dose pack for day and Flexeril for night.  F/u neck xray results. Orders: T-DG Cervical Spine 2-3 Views [id number])  Complete Medication List: 1)  Lovastatin 40 Mg Tabs (Lovastatin) .... Take 1 tablet by mouth once a day 2)  Aspirin 81 Mg Tbec (Aspirin) .... Take 1 tablet by mouth once a day 3)  Lisinopril-hydrochlorothiazide 20-25 Mg Tabs (Lisinopril-hydrochlorothiazide) .Marland Kitchen.. 1 tab by mouth daily 4)  Fluticasone Propionate 50 Mcg/act Susp (Fluticasone propionate) .... 2 sprays/ nostril once daily 5)  Medrol (pak) 4 Mg Tabs (Methylprednisolone) .... Take as directed 6)  Cyclobenzaprine Hcl 5 Mg Tabs (Cyclobenzaprine hcl) .Marland Kitchen.. 1-2 tabs by mouth at bedtime x 7 days  Patient Instructions: 1)  Stop Celebrex. 2)  Start Medrol Dose Pack and take all of it to reduce inflammation. 3)  Use Cyclobenzaprin (muscle relaxer) at night x 1  wk. 4)  Xray neck today. 5)  Will call you w/ results tomorrow. 6)  Call if not improving in 10 days. Prescriptions: CYCLOBENZAPRINE HCL 5 MG TABS (CYCLOBENZAPRINE HCL) 1-2 tabs by mouth at bedtime x 7 days  #14 x 0   Entered and Authorized by:   Seymour Bars DO   Signed by:   Seymour Bars DO on 05/18/2010   Method used:   Electronically to        [facility] (retail)       [address]       [place], [place]  [address]       Ph: [phone]       Fax: [phone]   RxID:   [id number] MEDROL (PAK) 4 MG TABS (METHYLPREDNISOLONE) take as directed  #1 pack x 0   Entered and Authorized by:   Seymour Bars DO   Signed by:   Seymour Bars DO on 05/18/2010   Method used:   Electronically to        [facility] (retail)        [address]       [place], [place]  [address]       Ph: [phone]       Fax: [phone]   RxID:   [id number]    Orders Added: 1)  T-DG Cervical Spine 2-3 Views [[address]] 2)  Est. Patient Level III [009[address]]

## 2010-05-27 ENCOUNTER — Encounter: Payer: Self-pay | Admitting: Family Medicine

## 2010-05-27 ENCOUNTER — Ambulatory Visit (INDEPENDENT_AMBULATORY_CARE_PROVIDER_SITE_OTHER): Payer: BC Managed Care – PPO | Admitting: Family Medicine

## 2010-05-27 DIAGNOSIS — G540 Brachial plexus disorders: Secondary | ICD-10-CM | POA: Insufficient documentation

## 2010-05-31 NOTE — Assessment & Plan Note (Signed)
Summary: TOS   Vital Signs:  Patient profile:   65 year old male Height:      66 inches (167.64 cm) Weight:      163.75 pounds (74.43 kg) BMI:     26.53 O2 Sat:      95 % on Room air Temp:     97.5 degrees F (36.39 degrees C) oral Pulse rate:   77 / minute BP sitting:   113 / 74  (right arm) Cuff size:   regular  Vitals Entered By: Lucious Groves CMA (May 27, 2010 11:20 AM)  O2 Flow:  Room air CC: F/U arm and shoulder pain./kb Is Patient Diabetic? No Pain Assessment Patient in pain? yes     Location: arm Intensity: 2 Type: aching Onset of pain  continued from previous Comments Patient ntoes that his pain is mostly of the arm now. Patient is currently not using Fluticasone.   Primary Care Provider:  Seymour Bars DO  CC:  F/U arm and shoulder pain./kb.  History of Present Illness: 65 yo WM presents for f/u R arm pain and numbnesness and tingling.  He noticed a big improvement with the Medrol Dose Pak  which really helped but not that he is off, his pain is worse esp at night.  Denies any functional weakness at home or at work.  He has maintaintained full ROM of his neck and shoulder.  On XRAY, he was found to have bilat cervical ribs which makes the diagnosis of Thoracic Outlet syndrome very likely.  Flexeril not helping.  Current Medications (verified): 1)  Lovastatin 40 Mg Tabs (Lovastatin) .... Take 1 Tablet By Mouth Once A Day 2)  Aspirin 81 Mg Tbec (Aspirin) .... Take 1 Tablet By Mouth Once A Day 3)  Lisinopril-Hydrochlorothiazide 20-25 Mg Tabs (Lisinopril-Hydrochlorothiazide) .Marland Kitchen.. 1 Tab By Mouth Daily 4)  Fluticasone Propionate 50 Mcg/act Susp (Fluticasone Propionate) .... 2 Sprays/ Nostril Once Daily  Allergies (verified): No Known Drug Allergies  Past History:  Past Medical History: Reviewed history from 10/08/2008 and no changes required. Hepatitis A 1998 with elevated LFTs high cholestrol HTN IFG  Stress test normal 09 Colonoscopy normal  08  Review of Systems      See HPI  Physical Exam  General:  alert, well-developed, well-nourished, and well-hydrated.   Head:  normocephalic and atraumatic.   Msk:  full C spine  and R glenohumeral ROM w/o pain.  slight tenderness to palpation under middle R clavicle and with shoulder adduction and external rotation Pulses:  2+ radial and ulnar pulses Extremities:  no UE or LE edema Neurologic:  sensation intact to light touch.  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 Improved some with Medrol Dose pack but pain has returned off the meds.  Will replace with naproxen 500 mg two times a day with food and set him up with ortho.  He will likely need to start PT.  Reviewed findings with pt today including showing him his XRAYs.  Given info h/o on TOS. Orders: Orthopedic Surgeon Referral (Ortho Surgeon)  Complete Medication List: 1)  Lovastatin 40 Mg Tabs (Lovastatin) .... Take 1 tablet by mouth once a day 2)  Lisinopril-hydrochlorothiazide 20-25 Mg Tabs (Lisinopril-hydrochlorothiazide) .Marland Kitchen.. 1 tab by mouth daily 3)  Fluticasone Propionate 50 Mcg/act Susp (Fluticasone propionate) .... 2 sprays/ nostril once daily 4)  Naproxen 500 Mg Tabs (  Naproxen) .Marland Kitchen.. 1 tab by mouth two times a day with breakfast and dinner  Patient Instructions: 1)  Start Naproxen as your anti - inflammatory 2 x a day wtih food.   2)  Stop Aspirin while you are on this. 3)  REferral made to [facility].   Prescriptions: NAPROXEN 500 MG TABS (NAPROXEN) 1 tab by mouth two times a day with breakfast and dinner  #60 x 0   Entered and Authorized by:   Seymour Bars DO   Signed by:   Seymour Bars DO on 05/27/2010   Method used:   Electronically to        [facility] (retail)       [address]       [place], [place]  [address]       Ph: [phone]       Fax:  [phone]   RxID:   [id number]    Orders Added: 1)  Orthopedic Surgeon Referral [Ortho Surgeon] 2)  Est. Patient Level III [84[address]]

## 2010-06-02 ENCOUNTER — Telehealth: Payer: Self-pay | Admitting: Family Medicine

## 2010-06-09 NOTE — Progress Notes (Signed)
Summary: Checking referral status  Phone Note Call from Patient   Caller: Patient Summary of Call: Patient called wanting to check the status on his referral to ortho at [facility] and I informed him that Legrand Rams was working on this referral and would be calling him with appt info... patient would like a call back at [phone] with his appt info... Thanks.Michaelle Copas  June 02, 2010 9:27 AM  Initial call taken by: Michaelle Copas,  June 02, 2010 9:27 AM  Follow-up for Phone Call        SW pt, appt has been established at [facility] 06/06/10 11:00, called pt back & gave info Follow-up by: Lannette Donath,  June 02, 2010 10:01 AM

## 2010-06-16 ENCOUNTER — Telehealth (INDEPENDENT_AMBULATORY_CARE_PROVIDER_SITE_OTHER): Payer: Self-pay | Admitting: *Deleted

## 2010-06-30 NOTE — Progress Notes (Signed)
Summary: RC  Phone Note Call from Patient   Caller: Patient Summary of Call: Pt LMOM asking for Dr. Cathey Endow to call him to discuss naproxen and arm pain. Please call Pt at [phone] Initial call taken by: Payton Spark CMA,  June 16, 2010 3:03 PM  Follow-up for Phone Call        Pls call pt back to clarify.  I have that he is taking RX Naproxen 2 x a day with food for pain.  Let me know if any specific questions.  I did see that he saw ortho. Follow-up by: Seymour Bars DO,  June 16, 2010 6:11 PM  Additional Follow-up for Phone Call Additional follow up Details #1::        [facility] for Pt to CB if still has questions.  Additional Follow-up by: Payton Spark CMA,  June 20, 2010 9:03 AM

## 2010-08-16 NOTE — Assessment & Plan Note (Signed)
[facility]                            CARDIOLOGY OFFICE NOTE   NAME:THIBEAULTDaryus, Brad Cannon                   MRN:          [id number]  DATE:11/27/2007                            DOB:          22-Aug-1945    [name] is a 65 year old male with past medical history of  hypertension, hyperlipidemia, who I am asked to evaluate for an abnormal  electrocardiogram.  He has no prior cardiac history.  Note, he does not  have dyspnea on exertion, orthopnea, PND, pedal edema, palpitations,  presyncope, syncope, or exertional chest pain.  He can walk up to 1 hour  at a time without having any symptoms.  He was recently seen for a  routine physical examination and was found to have an abnormal  electrocardiogram and we were asked to further evaluate.   MEDICATIONS:  His present medications include;  1. Lovastatin 40 mg p.o. daily.  2. Aspirin 81 mg p.o. daily.  3. Lisinopril HCT 20/25 daily.   He has known drug allergies.   SOCIAL HISTORY:  He does not smoke.  He rarely consumes alcohol.   His family history is negative for coronary disease in his immediate  family.  His father did have previous CVA.   His past medical history, there is no diabetes mellitus, but there is  hypertension and hyperlipidemia.  He has a history of nephrolithiasis.  He has had prior knee surgery on a knee tendon.   His review of systems, he denies any headaches, fevers, or chills.  There is no productive cough or hemoptysis.  There is no dysphagia,  odynophagia, melena, or hematochezia.  There is no dysuria or hematuria.  There is no rashes or seizure activity.  There is no orthopnea, PND, or  pedal edema.  Remaining systems are negative.   PHYSICAL EXAMINATION:  VITAL SIGNS:  Shows a blood pressure of 126/81  and his pulse is 82.  Weighs is 180 pounds.  GENERAL:  He is well-developed, well-nourished, in no acute distress.  SKIN:  Warm and dry.  He does not appear to be  depressed.  There is no  peripheral clubbing.  BACK:  Normal.  HEENT:  Normal eyelids.  NECK:  Supple with a normal upstroke bilaterally.  No bruits noted.  There is no jugular distention.  I cannot appreciate thyromegaly.  CHEST:  Clear to auscultation.  No expansion.  CARDIOVASCULAR:  Regular rate and rhythm.  Normal S1 and S2.  No  murmurs, rubs, or gallops noted.  ABDOMEN:  Nontender.  Distended positive bowel sounds.  No  hepatosplenomegaly, no mass appreciated.  There is no abdominal bruit.  He has 2+ femoral pulses bilaterally.  No bruits.  EXTREMITIES:  Showed no edema and I could palpate no cords.  He has 2+  dorsalis pedis pulses bilaterally.  NEURO:  Grossly intact.  I do have an electrocardiogram dated November 01, 2007.  This is from Dr. Ovidio Cannon office.  This showed a sinus rhythm with  a first-degree AV block.  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  There were no ST changes noted.  DIAGNOSES:  1. 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  However, he has no      cardiac symptoms including no syncope.  He does have risk factors      of hypertension as well as hyperlipidemia.  He is also      contemplating increasing his exercise program to include      racquetball.  We will plan to proceed with an exercise treadmill.      If it is low-risk, then we will not pursue further cardiac      evaluation.  2. Hypertension - His blood pressure is adequately controlled on his      present medications.  3. Hyperlipidemia - Continue his lovastatin and Dr. Cathey Cannon is following      this.   We will see him back on an as needed basis if his treadmill was normal.     Brad Cannon. Brad Som, MD, Brad Cannon  Electronically Signed    [facility]  DD: 11/27/2007  DT: 11/28/2007  Job #: [id number]   cc:   Brad Cannon, D.O.

## 2010-08-16 NOTE — Procedures (Signed)
[facility]                              EXERCISE TREADMILL   NAME:Brad Cannon, Brad Cannon                   MRN:          [id number]  DATE:12/20/2007                            DOB:          03/30/1946    The patient is a 65 year old male who has hypertension and  hyperlipidemia.  He is contemplating an increase in his exercise  program.  The study was performed for risk stratification prior to that.  The patient exercised for a duration of 9 minutes and 52 seconds on the  Bruce protocol, which is equivalent of 11.5 METS.  His heart rate  increased from resting of 87 to a maximum of 134, which is 84% of the  predicted maximum.  His blood pressure at rest is 113/83 and increased  to 168/82.  There is no chest pain during the study.  There are no ST  changes.  The study was terminated secondary to fatigue.  There were  occasional PVCs.   Negative adequate exercise tolerance test with no chest pain and  electrocardiographic changes.  There is also a normal blood pressure  response.  This would be interpreted as a low-risk study.     Madolyn Frieze Brad Som, MD, [facility]  Electronically Signed    BSC/MedQ  DD: 12/20/2007  DT: 12/21/2007  Job #: [id number]   cc:   Seymour Bars, D.O.

## 2010-11-01 ENCOUNTER — Telehealth: Payer: Self-pay | Admitting: Family Medicine

## 2010-11-01 DIAGNOSIS — Z13220 Encounter for screening for lipoid disorders: Secondary | ICD-10-CM

## 2010-11-01 DIAGNOSIS — Z125 Encounter for screening for malignant neoplasm of prostate: Secondary | ICD-10-CM

## 2010-11-01 DIAGNOSIS — Z130 Encounter for screening for diseases of the blood and blood-forming organs and certain disorders involving the immune mechanism: Secondary | ICD-10-CM

## 2010-11-01 NOTE — Telephone Encounter (Signed)
Done

## 2010-11-01 NOTE — Telephone Encounter (Signed)
Will forward to Dr Cathey Endow.

## 2010-11-01 NOTE — Telephone Encounter (Signed)
Patient called scheduled a cpe for 11/21/10 afternoon but request to have lab order for fasting labs sent downstairs for the morning. Pt request a call back to know if he can go 11/21/10 morning to have labs drawn.

## 2010-11-01 NOTE — Telephone Encounter (Signed)
LMOM informing Pt  

## 2010-11-16 ENCOUNTER — Encounter: Payer: Self-pay | Admitting: Family Medicine

## 2010-11-21 ENCOUNTER — Ambulatory Visit (INDEPENDENT_AMBULATORY_CARE_PROVIDER_SITE_OTHER): Payer: BC Managed Care – PPO | Admitting: Family Medicine

## 2010-11-21 ENCOUNTER — Encounter: Payer: Self-pay | Admitting: Family Medicine

## 2010-11-21 VITALS — BP 126/85 | HR 73 | Ht 66.0 in | Wt 169.0 lb

## 2010-11-21 DIAGNOSIS — Z0000 Encounter for general adult medical examination without abnormal findings: Secondary | ICD-10-CM

## 2010-11-21 MED ORDER — LOVASTATIN 40 MG PO TABS: 40.0000 mg | ORAL_TABLET | Freq: Every day | ORAL | Status: DC

## 2010-11-21 MED ORDER — LISINOPRIL-HYDROCHLOROTHIAZIDE 20-25 MG PO TABS: 1.0000 | ORAL_TABLET | Freq: Every day | ORAL | Status: DC

## 2010-11-21 NOTE — Progress Notes (Signed)
  Subjective:    Patient ID: Brad Cannon, male    DOB: [birth year], 65 y.o.   MRN: [id number]  HPI  65 yo WM presents for CPE.  He is doing well.  Going for fasting labs today.  He denies chest pain, SOB. Palpitations or leg swelling.  His father had heart dz in his 21s.  His sisters have not had any problems.  No presyncope.    He eats fairly healthy and walks some for exercise.  He works Community education officer at [employer].  This thoracic outlet symptoms have completely resolved. 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  Needs RF of meds.  Had a colonoscopy done in Dec 08 that was normal.    BP 126/85  Pulse 73  Ht 5\' 6"  (1.676 m)  Wt 169 lb (76.658 kg)  BMI 27.28 kg/m2  SpO2 97%   Review of Systems Gen: no fevers, chills, hot flashes, night sweats, change in weight GI: no N/V/C/D GU: no dysuria, incontinence or sexual dysfunction CV: no chest pain, DOE, palpitations s or edema Pulm:  Denies CP, SOB or chronic cough     Objective:   Physical Exam  Genitourinary: Prostate is enlarged (1+). Prostate is not tender.   Gen: alert, well groomed in NAD Neck: no thyromegaly or cervical lymphadenopathy CV: RRR w/o murmur, no audible carotid bruits or abdominal aortic bruits Ext: no edema, clubbing or cyanosis Lungs: CTA bilat w/o W/R/R; nonlabored HEENT:  [place]/AT; PERRLA; oropharynx pink and moist with good dentition Abd: soft, NT, ND, NABS, No HSM, no audible AA bruits Skin: warm and dry; no rash, pallor or jaundice Psych: does not appear anxious or depressed; answers questions appropriately     Assessment & Plan:  Assesment:  1. CPE- Keeping healthy checklist for men reviewed today.  BP at goal.  BMI 27 in the overwt range.     Labs ordered Colonoscopy done 12-08, normal DRE/ PSA today Will return for Pneumovax + Flu shot in the next 6 wks. Encouraged healthy diet, regular exercise, MVI daily. Return for next physical in 1 yr.

## 2010-11-21 NOTE — Patient Instructions (Signed)
Update fasting labs today. Will call you w/ results tomorrow.  Continue healthy diet, regular exercise.    Plan to update a stress test this year or next year.  Return for f/u BP/ cholesterol in 6 mos.

## 2010-11-22 ENCOUNTER — Telehealth: Payer: Self-pay | Admitting: Family Medicine

## 2010-11-22 LAB — COMPLETE METABOLIC PANEL WITH GFR
Albumin: 4.8 g/dL (ref 3.5–5.2)
CO2: 30 mEq/L (ref 19–32)
GFR, Est African American: >60 mL/min (ref >60)
GFR, Est Non African American: >60 mL/min (ref >60)
Glucose, Bld: 90 mg/dL (ref 70–99)
Sodium: 139 mEq/L (ref 135–145)
Total Bilirubin: 0.8 mg/dL (ref 0.3–1.2)
Total Protein: 7.6 g/dL (ref 6.0–8.3)

## 2010-11-22 LAB — LIPID PANEL
LDL Cholesterol: 87 mg/dL (ref 0–99)
Triglycerides: 88 mg/dL (ref <150)

## 2010-11-22 NOTE — Telephone Encounter (Signed)
Pls let pt know that his fasting sugar and kidney function came back normal.  Chronic stable mild elevation in one liver enzyme.  Prostate cancer screen is normal.  Cholesterol looks great.  Stay on current meds.  Repeat in 1 yr.

## 2010-11-23 NOTE — Telephone Encounter (Signed)
Pt aware.

## 2010-12-20 ENCOUNTER — Telehealth: Payer: Self-pay | Admitting: Family Medicine

## 2010-12-20 NOTE — Telephone Encounter (Signed)
Pt called and said [facility] had billed him for labs he had done back in August 2012.  He had a CPE and the labs were drawn on the same day and the codes used were:  V77.99(Screen endo C), V76.44 (Prostate ), V77.91 (Lynoid disorder).  Had the lab add 2 new codes since pt's insurance was not covering the labs.  Added code 272.4 for hyperlipidemia, and added code 401.9. Plan:  [facility] for the pt instructing him the [facility] will refile insurance with these added codes.  Told pt to contact our office if anymore additional problems. Brad Newcomer, LPN [facility]

## 2011-11-20 ENCOUNTER — Telehealth: Payer: Self-pay | Admitting: *Deleted

## 2011-11-20 DIAGNOSIS — Z0000 Encounter for general adult medical examination without abnormal findings: Secondary | ICD-10-CM

## 2011-11-20 NOTE — Telephone Encounter (Signed)
Lab order entered.

## 2011-11-28 ENCOUNTER — Encounter: Payer: Self-pay | Admitting: Family Medicine

## 2011-11-28 ENCOUNTER — Ambulatory Visit (INDEPENDENT_AMBULATORY_CARE_PROVIDER_SITE_OTHER): Payer: BC Managed Care – PPO | Admitting: Family Medicine

## 2011-11-28 VITALS — BP 130/82 | HR 73 | Ht 66.0 in | Wt 163.0 lb

## 2011-11-28 DIAGNOSIS — Z23 Encounter for immunization: Secondary | ICD-10-CM

## 2011-11-28 DIAGNOSIS — Z1331 Encounter for screening for depression: Secondary | ICD-10-CM

## 2011-11-28 DIAGNOSIS — I10 Essential (primary) hypertension: Secondary | ICD-10-CM

## 2011-11-28 DIAGNOSIS — Z9181 History of falling: Secondary | ICD-10-CM

## 2011-11-28 DIAGNOSIS — E785 Hyperlipidemia, unspecified: Secondary | ICD-10-CM

## 2011-11-28 LAB — COMPLETE METABOLIC PANEL WITH GFR
AST: 50 U/L — ABNORMAL HIGH (ref 0–37)
Albumin: 4.5 g/dL (ref 3.5–5.2)
Alkaline Phosphatase: 53 U/L (ref 39–117)
BUN: 20 mg/dL (ref 6–23)
Creat: 0.94 mg/dL (ref 0.50–1.35)
GFR, Est Non African American: 84 mL/min
Glucose, Bld: 100 mg/dL — ABNORMAL HIGH (ref 70–99)
Total Bilirubin: 0.8 mg/dL (ref 0.3–1.2)

## 2011-11-28 LAB — PSA: PSA: 1.57 ng/mL (ref <=4.00)

## 2011-11-28 LAB — LIPID PANEL
Cholesterol: 179 mg/dL (ref 0–200)
HDL: 44 mg/dL (ref >39)
Total CHOL/HDL Ratio: 4.1 Ratio

## 2011-11-28 NOTE — Progress Notes (Signed)
  Subjective:    Patient ID: Brad Cannon, male    DOB: [birth year], 65 y.o.   MRN: [id number]  Hyperlipidemia This is a chronic problem. The problem is uncontrolled. He has no history of nephrotic syndrome. Pertinent negatives include no chest pain, leg pain, myalgias or shortness of breath. Current antihyperlipidemic treatment includes exercise. The current treatment provides moderate improvement of lipids. There are no compliance problems.   Hypertension This is a chronic problem. The current episode started more than 1 year ago. The problem is unchanged. The problem is controlled. Associated symptoms include blurred vision. Pertinent negatives include no chest pain, peripheral edema or shortness of breath. There are no associated agents to hypertension. Compliance problems include diet.       Review of Systems  Eyes: Positive for blurred vision.  Respiratory: Negative for shortness of breath.   Cardiovascular: Negative for chest pain.  Musculoskeletal: Negative for myalgias.       Objective:   Physical Exam  Constitutional: He is oriented to person, place, and time. He appears well-developed and well-nourished.  HENT:  Head: Normocephalic and atraumatic.  Neck: Neck supple. No thyromegaly present.  Cardiovascular: Normal rate, regular rhythm and normal heart sounds.        No carotid bruits.    Pulmonary/Chest: Effort normal and breath sounds normal.  Musculoskeletal: He exhibits no edema.  Lymphadenopathy:    He has no cervical adenopathy.  Neurological: He is alert and oriented to person, place, and time.  Skin: Skin is warm and dry.  Psychiatric: He has a normal mood and affect. His behavior is normal.          Assessment & Plan:  HTN - well controlled. Continue current regimen. Followup in 6 months. Due for CMP and fasting lipid panel. He will go today.  Hyperlipidemia-tolerating his statin well without any side effects. Due to repeat lipid panel today. He  exercises regularly which is fantastic.  Pneumococcal vaccine updated today.  PSA screening. Patient will go today.  Fall risk screen-fall risk of 1, low risk.  Depression screen-PHQ 9 score of 0. Negative for depression.

## 2011-11-29 NOTE — Telephone Encounter (Signed)
Pt will call back in the am for results

## 2011-11-30 ENCOUNTER — Other Ambulatory Visit: Payer: Self-pay | Admitting: Family Medicine

## 2011-11-30 DIAGNOSIS — R748 Abnormal levels of other serum enzymes: Secondary | ICD-10-CM

## 2011-11-30 MED ORDER — SIMVASTATIN 40 MG PO TABS: 40.0000 mg | ORAL_TABLET | Freq: Every evening | ORAL | Status: DC

## 2011-12-07 ENCOUNTER — Ambulatory Visit (INDEPENDENT_AMBULATORY_CARE_PROVIDER_SITE_OTHER): Payer: BC Managed Care – PPO

## 2011-12-07 DIAGNOSIS — R748 Abnormal levels of other serum enzymes: Secondary | ICD-10-CM

## 2011-12-07 DIAGNOSIS — N289 Disorder of kidney and ureter, unspecified: Secondary | ICD-10-CM

## 2011-12-07 DIAGNOSIS — R7989 Other specified abnormal findings of blood chemistry: Secondary | ICD-10-CM

## 2011-12-08 ENCOUNTER — Other Ambulatory Visit: Payer: Self-pay | Admitting: Family Medicine

## 2011-12-08 DIAGNOSIS — E278 Other specified disorders of adrenal gland: Secondary | ICD-10-CM

## 2011-12-12 ENCOUNTER — Ambulatory Visit (INDEPENDENT_AMBULATORY_CARE_PROVIDER_SITE_OTHER): Payer: BC Managed Care – PPO

## 2011-12-12 DIAGNOSIS — N281 Cyst of kidney, acquired: Secondary | ICD-10-CM

## 2011-12-12 DIAGNOSIS — E278 Other specified disorders of adrenal gland: Secondary | ICD-10-CM

## 2011-12-15 ENCOUNTER — Other Ambulatory Visit: Payer: Self-pay | Admitting: *Deleted

## 2011-12-15 MED ORDER — LISINOPRIL-HYDROCHLOROTHIAZIDE 20-25 MG PO TABS: 1.0000 | ORAL_TABLET | Freq: Every day | ORAL | Status: DC

## 2011-12-20 NOTE — Telephone Encounter (Signed)
Patient notified and [place] was done and he will f/u on labs in late November for a Lipid recheck

## 2012-02-02 ENCOUNTER — Ambulatory Visit (INDEPENDENT_AMBULATORY_CARE_PROVIDER_SITE_OTHER): Payer: BC Managed Care – PPO | Admitting: Family Medicine

## 2012-02-02 ENCOUNTER — Encounter: Payer: Self-pay | Admitting: Family Medicine

## 2012-02-02 VITALS — BP 111/69 | HR 73 | Wt 167.0 lb

## 2012-02-02 DIAGNOSIS — R040 Epistaxis: Secondary | ICD-10-CM

## 2012-02-02 NOTE — Progress Notes (Signed)
  Subjective:    Patient ID: Brad Cannon, male    DOB: 29-Oct-1945, 66 y.o.   MRN: [id number]  HPI Nosebleeds on the right side started again about 3 days ago.  Last time happened was a few years ago and had to see ENT for cautery, after seeing me for initial cautery.  Only happened at night and woke him.  Not blowing nose a lot and no URI sxs.  No fever.    Review of Systems     Objective:   Physical Exam  Constitutional: He appears well-developed and well-nourished.  HENT:  Head: Normocephalic and atraumatic.  Nose: Nose normal.       Right nostril with streaky blood on the septum. Scab on the lateral mucosa.  No sig edema or trauma          Assessment & Plan:  Nosebleed - Cautery w/ nitro swab tolerated well. Discussed usig hydrating saline gel. Given samples of [place].  Discussed running humidifier at night. Avoid trauma or picking or excessive blowing of th nose. Can use cotton ball soaked with Affrin is starts to bleed. Call for ENT referral if bleeds again.

## 2012-02-02 NOTE — Patient Instructions (Signed)

## 2012-02-19 ENCOUNTER — Telehealth: Payer: Self-pay | Admitting: *Deleted

## 2012-02-19 DIAGNOSIS — E785 Hyperlipidemia, unspecified: Secondary | ICD-10-CM

## 2012-02-21 LAB — LIPID PANEL
Cholesterol: 134 mg/dL (ref 0–200)
HDL: 45 mg/dL (ref >39)
LDL Cholesterol: 76 mg/dL (ref 0–99)
Triglycerides: 64 mg/dL (ref <150)
VLDL: 13 mg/dL (ref 0–40)

## 2012-02-22 ENCOUNTER — Other Ambulatory Visit: Payer: Self-pay | Admitting: *Deleted

## 2012-02-22 MED ORDER — SIMVASTATIN 40 MG PO TABS
40.0000 mg | ORAL_TABLET | Freq: Every evening | ORAL | Status: DC
Start: 2012-02-22 — End: 2012-08-31

## 2012-05-30 ENCOUNTER — Ambulatory Visit (INDEPENDENT_AMBULATORY_CARE_PROVIDER_SITE_OTHER): Payer: BC Managed Care – PPO | Admitting: Family Medicine

## 2012-05-30 ENCOUNTER — Encounter: Payer: Self-pay | Admitting: Family Medicine

## 2012-05-30 VITALS — BP 114/76 | HR 71 | Ht 66.0 in | Wt 172.0 lb

## 2012-05-30 DIAGNOSIS — I10 Essential (primary) hypertension: Secondary | ICD-10-CM

## 2012-05-30 DIAGNOSIS — E785 Hyperlipidemia, unspecified: Secondary | ICD-10-CM

## 2012-05-30 LAB — COMPLETE METABOLIC PANEL WITH GFR
BUN: 17 mg/dL (ref 6–23)
CO2: 27 mEq/L (ref 19–32)
Calcium: 9.7 mg/dL (ref 8.4–10.5)
Chloride: 102 mEq/L (ref 96–112)
Creat: 0.92 mg/dL (ref 0.50–1.35)
GFR, Est African American: >89 mL/min
Total Bilirubin: 0.9 mg/dL (ref 0.3–1.2)

## 2012-05-30 MED ORDER — LISINOPRIL-HYDROCHLOROTHIAZIDE 20-25 MG PO TABS: 1.0000 | ORAL_TABLET | Freq: Every day | ORAL | Status: DC

## 2012-05-30 NOTE — Progress Notes (Signed)
  Subjective:    Patient ID: Brad Cannon, male    DOB: Oct 22, 1945, 67 y.o.   MRN: [id number]  HPI HTN-  Pt denies chest pain, SOB, dizziness, or heart palpitations.  Taking meds as directed w/o problems.  Denies medication side effects.  Does have times in the evening whenb feels really sleepy. Not sure if the meds or not.  Says hasn;'t been sleeping well over the last several weeks. The he plans to retire soon and is hopeful that this will help.Marland Kitchen   Hyperlipidemia - Tolerating statin well without any myalgias or side effects. He is taking it daily..  Hs spot on his head that won't go away.  It has been there for several months. Sometimes it comes off when he dries off with a towel after shower but it always comes back.  Review of Systems     Objective:   Physical Exam  Constitutional: He is oriented to person, place, and time. He appears well-developed and well-nourished.  HENT:  Head: Normocephalic and atraumatic.  Cardiovascular: Normal rate, regular rhythm and normal heart sounds.   No carotid bruits.   Pulmonary/Chest: Effort normal and breath sounds normal.  Neurological: He is alert and oriented to person, place, and time.  Skin: Skin is warm and dry.  Small 3-4 mm white scaling lesion on the top of the scalp. He has significant hair loss on the top of the scalp.  Psychiatric: He has a normal mood and affect. His behavior is normal.          Assessment & Plan:  HNT- WEll controlled. Continue current regimen. Followup in 6 months. Due for BMP to check electrolytes and kidney function.  Hyperlidemia - well-controlled at last check in November. Continue current regimen. Followup in 6 months. Lab Results  Component Value Date   CHOL 134 02/19/2012   HDL 45 02/19/2012   LDLCALC 76 02/19/2012   TRIG 64 02/19/2012   CHOLHDL 3.0 02/19/2012    AK - discussed diagnosis. Discussed treatment options including cryotherapy. Patient opted to be treated with cryotherapy.  Follow up wound care discussed. Patient encouraged to apply sunscreen or to avoid future segment image.2 Cryotherapy Procedure Note  Pre-operative Diagnosis: Actinic keratosis  Post-operative Diagnosis: Actinic keratosis  Locations: superior  scalp  Indications: not resolving, pre-skin cancer  Anesthesia: not required    Procedure Details  Patient informed of risks (permanent scarring, infection, light or dark discoloration, bleeding, infection, weakness, numbness and recurrence of the lesion) and benefits of the procedure and verbal informed consent obtained.  The areas are treated with liquid nitrogen therapy, frozen until ice ball extended 2 mm beyond lesion, allowed to thaw, and treated again. The patient tolerated procedure well.  The patient was instructed on post-op care, warned that there may be blister formation, redness and pain. Recommend OTC analgesia as needed for pain.  Condition: Stable  Complications: none.  Plan: 1. Instructed to keep the area dry and covered for 24-48h and clean thereafter. 2. Warning signs of infection were reviewed.   3. Recommended that the patient use OTC acetaminophen as needed for pain.  4. Return prn

## 2012-06-21 ENCOUNTER — Telehealth: Payer: Self-pay | Admitting: *Deleted

## 2012-06-21 DIAGNOSIS — R935 Abnormal findings on diagnostic imaging of other abdominal regions, including retroperitoneum: Secondary | ICD-10-CM

## 2012-06-21 NOTE — Telephone Encounter (Signed)
I agree. He needs her ultrasound of abdomen to followup on a possible aortic aneurysm. Order placed. It will be done at [facility], hopefully

## 2012-06-21 NOTE — Telephone Encounter (Signed)
Patient notified. Edwena Mayorga, LPN  

## 2012-06-21 NOTE — Telephone Encounter (Signed)
Pt called & states that you wanted him to get a repeat CT of his abdomen.  After reviewing his results of the last CT I saw that you wanted a repeat US of his abdominal aorta in 6 months. Please advise

## 2012-07-02 ENCOUNTER — Ambulatory Visit (INDEPENDENT_AMBULATORY_CARE_PROVIDER_SITE_OTHER): Payer: BC Managed Care – PPO

## 2012-07-02 DIAGNOSIS — R935 Abnormal findings on diagnostic imaging of other abdominal regions, including retroperitoneum: Secondary | ICD-10-CM

## 2012-07-04 ENCOUNTER — Other Ambulatory Visit: Payer: Self-pay | Admitting: Family Medicine

## 2012-07-04 DIAGNOSIS — Q254 Congenital malformation of aorta unspecified: Secondary | ICD-10-CM

## 2012-07-29 ENCOUNTER — Encounter: Payer: Self-pay | Admitting: Vascular Surgery

## 2012-07-30 ENCOUNTER — Ambulatory Visit (INDEPENDENT_AMBULATORY_CARE_PROVIDER_SITE_OTHER): Payer: BC Managed Care – PPO | Admitting: Vascular Surgery

## 2012-07-30 ENCOUNTER — Encounter: Payer: Self-pay | Admitting: Vascular Surgery

## 2012-07-30 VITALS — BP 128/87 | HR 67 | Resp 20 | Ht 66.0 in | Wt 170.0 lb

## 2012-07-30 DIAGNOSIS — I714 Abdominal aortic aneurysm, without rupture, unspecified: Secondary | ICD-10-CM

## 2012-07-30 NOTE — Progress Notes (Signed)
[facility]   Patient name: Brad Cannon MRN: [id number] DOB: 1945-08-31 Sex: male   Referred by: Linford Arnold  Reason for referral:  Chief Complaint  Patient presents with  . 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 He initially had an abdominal ultrasound evaluating abnormal liver function tests. This showed a potential renal cyst and therefore underwent CT scan for further evaluation of this. 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 This was in September 2013. He recently had a followup ultrasound of this and the maximal diameter was 2.6 cm where there was some question of thickening of the aortic wall. He is seen today for further discussion. He has no symptoms referable to this and has no history of aneurysm or family history of aneurysm. He has no history of peripheral vascular occlusive disease. No history of stroke.  Past Medical History  Diagnosis Date  . Hepatitis A 1998    with elevated LFT's  . Hyperlipidemia   . Hypertension   . IFG (impaired fasting glucose)   . AAA (abdominal aortic aneurysm)     Past Surgical History  Procedure Laterality Date  . Arthroscopic surgery  1982    left knee    History   Social History  . Marital Status: Married    Spouse Name: N/A    Number of Children: N/A  . Years of Education: N/A   Occupational History  . Not on file.   Social History Main Topics  . Smoking status: Never Smoker   . Smokeless tobacco: Never Used  . Alcohol Use: Yes  . Drug Use: No  . Sexually Active: Not on file   Other Topics Concern  . Not on file   Social History Narrative  . No narrative on file    Family History  Problem Relation Age of Onset  . Hypertension Mother   . Hyperlipidemia  Mother   . Heart disease Father   . Hypertension Sister   . Hyperlipidemia Sister   . Hyperlipidemia Brother   . Hypertension Brother     Allergies as of 07/30/2012  . (Not on File)    Current Outpatient Prescriptions on File Prior to Visit  Medication Sig Dispense Refill  . lisinopril-hydrochlorothiazide (PRINZIDE,ZESTORETIC) 20-25 MG per tablet Take 1 tablet by mouth daily.  90 tablet  1  . simvastatin (ZOCOR) 40 MG tablet Take 1 tablet (40 mg total) by mouth every evening.  90 tablet  1   No current facility-administered medications on file prior to visit.     REVIEW OF SYSTEMS:  Positives indicated with an "X"  CARDIOVASCULAR:  [ ]  chest pain   [ ]  chest pressure   [ ]  palpitations   [ ]  orthopnea   [ ]  dyspnea on exertion   [ ]  claudication   [ ]  rest pain   [ ]  DVT   [ ]  phlebitis PULMONARY:   [ ]  productive cough   [ ]  asthma   [ ]  wheezing NEUROLOGIC:   [ ]  weakness  [ ]  paresthesias  [ ]  aphasia  [ ]  amaurosis  [ ]  dizziness HEMATOLOGIC:   [ ]  bleeding problems   [ ]  clotting disorders MUSCULOSKELETAL:  [ ]  joint pain   [ ]   joint swelling GASTROINTESTINAL: [ ]   blood in stool  [ ]   hematemesis GENITOURINARY:  [ ]   dysuria  [ ]   hematuria PSYCHIATRIC:  [ ]  history of major depression INTEGUMENTARY:  [ ]  rashes  [ ]  ulcers CONSTITUTIONAL:  [ ]  fever   [ ]  chills  PHYSICAL EXAMINATION:  General: The patient is a well-nourished male, in no acute distress. Vital signs are BP 128/87  Pulse 67  Resp 20  Ht 5\' 6"  (1.676 m)  Wt 170 lb (77.111 kg)  BMI 27.45 kg/m2 Pulmonary: There is a good air exchange bilaterally without wheezing or rales. Abdomen: Soft and non-tender with normal pitch bowel sounds. No palpable aneurysm Musculoskeletal: There are no major deformities.  There is no significant extremity pain. Neurologic: No focal weakness or paresthesias are detected, Skin: There are no ulcer or rashes noted. Psychiatric: The patient has normal  affect. Cardiovascular: There is a regular rate and rhythm without significant murmur appreciated. Pulse status 2+ radial 2+ femoral 2+ popliteal 2+ dorsalis pedis pulses bilaterally with no evidence of peripheral aneurysm  CT scan from 913 was reviewed. This does show atherosclerotic changes of his aorta with some calcific calcification and mild ectasia in the infrarenal aorta. There is no evidence of aortitis or other area of inflammation.    Impression and Plan:  Mild ectasia of the infrarenal aorta. Had a long discussion with the patient explaining the significance of this. I do not see any evidence of aortitis or other concern. I did explain the probable continued slow growth of his aortic ectasia. I would recommend repeat ultrasound in 2 years for followup. We will schedule this in our office. I did discuss symptoms of leaking aneurysm and this would be extremely unlikely and also the open versus stent graft repair of aortic aneurysms been explained that he would not be considered for this unless his aneurysm reached 5-5.5 cm. He was reassured that this discussion will see [place] again in 2 years    Tela Kotecki [facility] [place] Office: [phone]

## 2012-07-30 NOTE — Addendum Note (Signed)
Addended by: Adria Dill L on: 07/30/2012 04:12 PM   Modules accepted: Orders

## 2012-08-31 ENCOUNTER — Other Ambulatory Visit: Payer: Self-pay | Admitting: Family Medicine

## 2012-11-27 ENCOUNTER — Other Ambulatory Visit: Payer: Self-pay | Admitting: *Deleted

## 2012-11-27 MED ORDER — SIMVASTATIN 40 MG PO TABS: ORAL_TABLET | ORAL | Status: DC

## 2012-12-25 ENCOUNTER — Other Ambulatory Visit: Payer: Self-pay | Admitting: *Deleted

## 2012-12-25 MED ORDER — LISINOPRIL-HYDROCHLOROTHIAZIDE 20-25 MG PO TABS: 1.0000 | ORAL_TABLET | Freq: Every day | ORAL | Status: DC

## 2013-02-13 ENCOUNTER — Other Ambulatory Visit: Payer: Self-pay

## 2013-02-13 DIAGNOSIS — E785 Hyperlipidemia, unspecified: Secondary | ICD-10-CM

## 2013-02-13 MED ORDER — SIMVASTATIN 40 MG PO TABS: ORAL_TABLET | ORAL | Status: DC

## 2013-02-19 ENCOUNTER — Encounter: Payer: Self-pay | Admitting: Family Medicine

## 2013-02-19 ENCOUNTER — Ambulatory Visit (INDEPENDENT_AMBULATORY_CARE_PROVIDER_SITE_OTHER): Payer: Medicare Other | Admitting: Family Medicine

## 2013-02-19 VITALS — BP 127/84 | HR 65 | Temp 96.9°F | Wt 171.0 lb

## 2013-02-19 DIAGNOSIS — E785 Hyperlipidemia, unspecified: Secondary | ICD-10-CM

## 2013-02-19 DIAGNOSIS — I10 Essential (primary) hypertension: Secondary | ICD-10-CM

## 2013-02-19 DIAGNOSIS — R7301 Impaired fasting glucose: Secondary | ICD-10-CM

## 2013-02-19 LAB — POCT GLYCOSYLATED HEMOGLOBIN (HGB A1C): Hemoglobin A1C: 5.4

## 2013-02-19 MED ORDER — SIMVASTATIN 40 MG PO TABS: ORAL_TABLET | ORAL | Status: DC

## 2013-02-19 NOTE — Addendum Note (Signed)
Addended by: Deno Etienne on: 02/19/2013 11:46 AM   Modules accepted: Orders

## 2013-02-19 NOTE — Progress Notes (Signed)
  Subjective:    Patient ID: Brad Cannon, male    DOB: September 26, 1945, 67 y.o.   MRN: [id number]  HPI HTN-  Pt denies chest pain, SOB, dizziness, or heart palpitations.  Taking meds as directed w/o problems.  Denies medication side effects.  Has been walking and wokring out at the gym.    Hyperlpidemia - Tolerating statin well.  No S.E or myalgias.   Hx of IFG - Don't have an A1c on file.   Review of Systems     Objective:   Physical Exam  Constitutional: He is oriented to person, place, and time. He appears well-developed and well-nourished.  HENT:  Head: Normocephalic and atraumatic.  Neck: Neck supple. No thyromegaly present.  Cardiovascular: Normal rate, regular rhythm and normal heart sounds.   Pulmonary/Chest: Effort normal and breath sounds normal.  Lymphadenopathy:    He has no cervical adenopathy.  Neurological: He is alert and oriented to person, place, and time.  Skin: Skin is warm and dry.  Psychiatric: He has a normal mood and affect. His behavior is normal.          Assessment & Plan:  HTN - well controlled.  Keep up the exercise. Asymptomatic. Call if any concern or lightheadedness or dizziness.   Hyperlipidemia - Due to recheck lipids. Givne labslipi today. Will call with results.   IFG - problem resolved. A1C was 5.4 today.  Will remove from problem list.

## 2013-02-21 LAB — COMPLETE METABOLIC PANEL WITH GFR
ALT: 15 U/L (ref 0–53)
AST: 47 U/L — ABNORMAL HIGH (ref 0–37)
CO2: 26 mEq/L (ref 19–32)
GFR, Est African American: >89 mL/min
Sodium: 140 mEq/L (ref 135–145)
Total Bilirubin: 0.8 mg/dL (ref 0.3–1.2)
Total Protein: 7.1 g/dL (ref 6.0–8.3)

## 2013-02-21 LAB — LIPID PANEL
HDL: 39 mg/dL — ABNORMAL LOW (ref >39)
LDL Cholesterol: 68 mg/dL (ref 0–99)
Total CHOL/HDL Ratio: 3.1 Ratio
VLDL: 12 mg/dL (ref 0–40)

## 2013-02-26 ENCOUNTER — Other Ambulatory Visit: Payer: Self-pay | Admitting: *Deleted

## 2013-02-26 DIAGNOSIS — E785 Hyperlipidemia, unspecified: Secondary | ICD-10-CM

## 2013-02-26 MED ORDER — SIMVASTATIN 40 MG PO TABS: ORAL_TABLET | ORAL | Status: DC

## 2013-04-09 ENCOUNTER — Ambulatory Visit (INDEPENDENT_AMBULATORY_CARE_PROVIDER_SITE_OTHER): Payer: Commercial Managed Care - HMO | Admitting: Physician Assistant

## 2013-04-09 ENCOUNTER — Encounter: Payer: Self-pay | Admitting: Physician Assistant

## 2013-04-09 VITALS — BP 101/59 | HR 82 | Temp 97.7°F | Wt 172.0 lb

## 2013-04-09 DIAGNOSIS — J019 Acute sinusitis, unspecified: Secondary | ICD-10-CM

## 2013-04-09 DIAGNOSIS — R51 Headache: Secondary | ICD-10-CM

## 2013-04-09 DIAGNOSIS — J029 Acute pharyngitis, unspecified: Secondary | ICD-10-CM

## 2013-04-09 DIAGNOSIS — R6883 Chills (without fever): Secondary | ICD-10-CM

## 2013-04-09 LAB — POCT INFLUENZA A/B
INFLUENZA A, POC: NEGATIVE
INFLUENZA B, POC: NEGATIVE

## 2013-04-09 MED ORDER — FLUTICASONE PROPIONATE 50 MCG/ACT NA SUSP: 2.0000 | Freq: Every day | NASAL | Status: DC

## 2013-04-09 MED ORDER — AMOXICILLIN 500 MG PO TABS: 500.0000 mg | ORAL_TABLET | Freq: Two times a day (BID) | ORAL | Status: DC

## 2013-04-09 NOTE — Patient Instructions (Signed)
Viral Pharyngitis Viral pharyngitis is a viral infection that produces redness, pain, and swelling (inflammation) of the throat. It can spread from person to person (contagious). CAUSES Viral pharyngitis is caused by inhaling a large amount of certain germs called viruses. Many different viruses cause viral pharyngitis. SYMPTOMS Symptoms of viral pharyngitis include:  Sore throat.  Tiredness.  Stuffy nose.  Low-grade fever.  Congestion.  Cough. TREATMENT Treatment includes rest, drinking plenty of fluids, and the use of over-the-counter medication (approved by your caregiver). HOME CARE INSTRUCTIONS   Drink enough fluids to keep your urine clear or pale yellow.  Eat soft, cold foods such as ice cream, frozen ice pops, or gelatin dessert.  Gargle with warm salt water (1 tsp salt per 1 qt of water).  If over age 17, throat lozenges may be used safely.  Only take over-the-counter or prescription medicines for pain, discomfort, or fever as directed by your caregiver. Do not take aspirin. To help prevent spreading viral pharyngitis to others, avoid:  Mouth-to-mouth contact with others.  Sharing utensils for eating and drinking.  Coughing around others. SEEK MEDICAL CARE IF:   You are better in a few days, then become worse.  You have a fever or pain not helped by pain medicines.  There are any other changes that concern you. Document Released: 12/28/2004 Document Revised: 06/12/2011 Document Reviewed: 05/26/2010 [facility] Patient Information 2014 [place], [place].      Sinusitis Sinusitis is redness, soreness, and swelling (inflammation) of the paranasal sinuses. Paranasal sinuses are air pockets within the bones of your face (beneath the eyes, the middle of the forehead, or above the eyes). In healthy paranasal sinuses, mucus is able to drain out, and air is able to circulate through them by way of your nose. However, when your paranasal sinuses are inflamed, mucus and  air can become trapped. This can allow bacteria and other germs to grow and cause infection. Sinusitis can develop quickly and last only a short time (acute) or continue over a long period (chronic). Sinusitis that lasts for more than 12 weeks is considered chronic.  CAUSES  Causes of sinusitis include:  Allergies.  Structural abnormalities, such as displacement of the cartilage that separates your nostrils (deviated septum), which can decrease the air flow through your nose and sinuses and affect sinus drainage.  Functional abnormalities, such as when the small hairs (cilia) that line your sinuses and help remove mucus do not work properly or are not present. SYMPTOMS  Symptoms of acute and chronic sinusitis are the same. The primary symptoms are pain and pressure around the affected sinuses. Other symptoms include:  Upper toothache.  Earache.  Headache.  Bad breath.  Decreased sense of smell and taste.  A cough, which worsens when you are lying flat.  Fatigue.  Fever.  Thick drainage from your nose, which often is green and may contain pus (purulent).  Swelling and warmth over the affected sinuses. DIAGNOSIS  Your caregiver will perform a physical exam. During the exam, your caregiver may:  Look in your nose for signs of abnormal growths in your nostrils (nasal polyps).  Tap over the affected sinus to check for signs of infection.  View the inside of your sinuses (endoscopy) with a special imaging device with a light attached (endoscope), which is inserted into your sinuses. If your caregiver suspects that you have chronic sinusitis, one or more of the following tests may be recommended:  Allergy tests.  Nasal culture A sample of mucus is taken from  your nose and sent to a lab and screened for bacteria.  Nasal cytology A sample of mucus is taken from your nose and examined by your caregiver to determine if your sinusitis is related to an allergy. TREATMENT  Most  cases of acute sinusitis are related to a viral infection and will resolve on their own within 10 days. Sometimes medicines are prescribed to help relieve symptoms (pain medicine, decongestants, nasal steroid sprays, or saline sprays).  However, for sinusitis related to a bacterial infection, your caregiver will prescribe antibiotic medicines. These are medicines that will help kill the bacteria causing the infection.  Rarely, sinusitis is caused by a fungal infection. In theses cases, your caregiver will prescribe antifungal medicine. For some cases of chronic sinusitis, surgery is needed. Generally, these are cases in which sinusitis recurs more than 3 times per year, despite other treatments. HOME CARE INSTRUCTIONS   Drink plenty of water. Water helps thin the mucus so your sinuses can drain more easily.  Use a humidifier.  Inhale steam 3 to 4 times a day (for example, sit in the bathroom with the shower running).  Apply a warm, moist washcloth to your face 3 to 4 times a day, or as directed by your caregiver.  Use saline nasal sprays to help moisten and clean your sinuses.  Take over-the-counter or prescription medicines for pain, discomfort, or fever only as directed by your caregiver. SEEK IMMEDIATE MEDICAL CARE IF:  You have increasing pain or severe headaches.  You have nausea, vomiting, or drowsiness.  You have swelling around your face.  You have vision problems.  You have a stiff neck.  You have difficulty breathing. MAKE SURE YOU:   Understand these instructions.  Will watch your condition.  Will get help right away if you are not doing well or get worse. Document Released: 03/20/2005 Document Revised: 06/12/2011 Document Reviewed: 04/04/2011 [facility] Patient Information 2014 [place], [place].

## 2013-04-09 NOTE — Progress Notes (Signed)
   Subjective:    Patient ID: Brad Cannon, male    DOB: 27-Jun-1945, 68 y.o.   MRN: [id number]  HPI patient is a 68 year old male who presents to the clinic with 5 days of frontal sinus pressure and sore throat. He reports he has been exposed to the flu at work. He does have body aches. He denies any ear pain, fever, shortness of breath or wheezing. His cough is productive. His sore throat seems to be worse in the morning and at night. He continues to be injury. He has tried TheraFlu and Mucinex. Mucinex seems to be helping but feels like he continues to keep symptoms.     Review of Systems     Objective:   Physical Exam  Constitutional: He is oriented to person, place, and time. He appears well-developed and well-nourished.  HENT:  Head: Normocephalic and atraumatic.  Right Ear: External ear normal.  Left Ear: External ear normal.  TMs clear bilaterally.  Oropharynx erythematous with no tonsillar exudate.  Bilateral turbinates red and swollen.  Frontal sinus are sure to palpation bilaterally.  Eyes: Conjunctivae are normal. Right eye exhibits no discharge. Left eye exhibits no discharge.  Neck: Normal range of motion. Neck supple.  Cardiovascular: Normal rate, regular rhythm and normal heart sounds.   Pulmonary/Chest: Effort normal and breath sounds normal. He has no wheezes.  Lymphadenopathy:    He has no cervical adenopathy.  Neurological: He is alert and oriented to person, place, and time.  Skin: Skin is warm.  Psychiatric: He has a normal mood and affect. His behavior is normal.          Assessment & Plan:  Acute sinusitis/acute pharyngitis- did not have high suspicious for flu;however, pt has been exposed and wanted to be tested to go back to work. Influenza negative.  discuss with patient likely that the sinus drainage is causing the sore throat. Will treat with Amoxil for 10 days. Did send a prescription for Flonase 2 sprays each nostril once a day. Gave  handout for symptomatic care of sore throat and sinus pressure. Discuss with patient he can continue to use Mucinex for the next day or so to help with congestion. Call if not improving.

## 2013-04-14 ENCOUNTER — Telehealth: Payer: Self-pay | Admitting: *Deleted

## 2013-04-14 NOTE — Telephone Encounter (Signed)
Pt notified of PA instructions and agrees with plan. Clemetine Marker, LPN

## 2013-04-14 NOTE — Telephone Encounter (Signed)
Pt calls this morning and states seen you last Wednsday and was given Amoxicillin- he started this that day and Sat and Sun bad diarrhea and stamach aches and cramps and thought it may be due to the Amox. So he stopped it on Sat.  Yesterday states he had another H/A but did take Aleve and this helped.  Wants to know if you can call in something different to [place]

## 2013-04-14 NOTE — Telephone Encounter (Signed)
Yes, diarrhea from abx is common but with all abx. If you were feeling better I would stick with mucinex for a few days and aleve as needed and see if continue to get better. If only having periodic HA's could be due to other things. Do you have nasal spray. Could use that to help open up better. Try OTC treatment for at least another 24 hours if continuing could then call something in but could mess stomach up again.

## 2013-05-22 ENCOUNTER — Other Ambulatory Visit: Payer: Self-pay | Admitting: *Deleted

## 2013-05-22 DIAGNOSIS — E785 Hyperlipidemia, unspecified: Secondary | ICD-10-CM

## 2013-05-22 MED ORDER — SIMVASTATIN 40 MG PO TABS: ORAL_TABLET | ORAL | Status: DC

## 2013-05-26 ENCOUNTER — Ambulatory Visit (INDEPENDENT_AMBULATORY_CARE_PROVIDER_SITE_OTHER): Payer: Medicare HMO | Admitting: Family Medicine

## 2013-05-26 ENCOUNTER — Encounter: Payer: Self-pay | Admitting: Family Medicine

## 2013-05-26 VITALS — BP 114/73 | HR 64 | Temp 97.6°F | Ht 66.0 in | Wt 170.0 lb

## 2013-05-26 DIAGNOSIS — R21 Rash and other nonspecific skin eruption: Secondary | ICD-10-CM

## 2013-05-26 MED ORDER — CEPHALEXIN 500 MG PO CAPS: 500.0000 mg | ORAL_CAPSULE | Freq: Three times a day (TID) | ORAL | Status: DC

## 2013-05-26 MED ORDER — METHYLPREDNISOLONE ACETATE 80 MG/ML IJ SUSP
80.0000 mg | Freq: Once | INTRAMUSCULAR | Status: AC
  Administered 2013-05-26: 80 mg via INTRAMUSCULAR

## 2013-05-26 NOTE — Patient Instructions (Signed)
Make sure no new soaps. Lotions, detergent, fabric softeners, etc If not seeing improvement in 2-3 days then can start the antibiotic.   We will call you with the skin scraping results probably tomorrow.

## 2013-05-26 NOTE — Progress Notes (Signed)
   Subjective:    Patient ID: Brad Cannon, male    DOB: 1945-10-23, 68 y.o.   MRN: [id number]  HPI Rash Started on forearms but has been spreading. To his torso and legs. He really doesn't have anything on his face. He is a little bit on the backs of his hands. Does swim in the pool at the gym.  Using hydrocortisone.  Has been taking benadryl as well.  Very itchey.  No fever.  He says the rash is very similar to a he had when he contracted hepatitis A several years ago in [place] except at that time he had extreme fatigue and some other symptoms. This time he does not feel tired. He was treated for a sinus infection about 6 weeks ago with amoxicillin. Around that time he did go out to eat and says that the salad dressing tasted odd, but continued to eat it and the next day he and his wife both had diarrhea. He denies any new soaps, lotions,  Softeners, detergents etc.   Review of Systems     Objective:   Physical Exam  Constitutional: He appears well-developed and well-nourished.  HENT:  Head: Normocephalic and atraumatic.  Skin: Skin is warm and dry. There is erythema.  confluent pink patches on forearms.  On back and torso has papules.  No pustules or vesicles. Excoriations on the backs of hands. Confluent erythema on the lower legs. Some dryness on lower legs but otherwise no scale  Psychiatric: He has a normal mood and affect. His behavior is normal.          Assessment & Plan:  Rash-unclear etiology. It looks very much like an allergic dermatitis. Will treat with prednisone. Given 80 mg of Depo-Medrol. If not responding within 2-3 days and will treat with oral Keflex assuming that this is some type of possible staph infection from the gym though it doesn't look quite consistent with that. Also consider a folliculitis that there are some areas that are more confluent. This could be from scratching. Make sure avoiding any new soaps lotions etc. Call if not better in one week.  We'll go ahead and do bloodwork today to look for CBC, acute hep panel, and liver enzymes.

## 2013-05-27 LAB — CBC
HCT: 43 % (ref 39.0–52.0)
Hemoglobin: 15.1 g/dL (ref 13.0–17.0)
MCH: 32.8 pg (ref 26.0–34.0)
MCHC: 35.1 g/dL (ref 30.0–36.0)
MCV: 93.3 fL (ref 78.0–100.0)
Platelets: 245 10*3/uL (ref 150–400)
RBC: 4.61 MIL/uL (ref 4.22–5.81)
RDW: 13.7 % (ref 11.5–15.5)
WBC: 6.9 10*3/uL (ref 4.0–10.5)

## 2013-05-27 LAB — COMPLETE METABOLIC PANEL WITH GFR
ALBUMIN: 4.6 g/dL (ref 3.5–5.2)
ALK PHOS: 52 U/L (ref 39–117)
ALT: 18 U/L (ref 0–53)
AST: 49 U/L — AB (ref 0–37)
BUN: 17 mg/dL (ref 6–23)
CO2: 29 mEq/L (ref 19–32)
Calcium: 9.7 mg/dL (ref 8.4–10.5)
Chloride: 102 mEq/L (ref 96–112)
Creat: 1.01 mg/dL (ref 0.50–1.35)
GFR, Est African American: 88 mL/min
GFR, Est Non African American: 76 mL/min
Glucose, Bld: 95 mg/dL (ref 70–99)
POTASSIUM: 3.9 meq/L (ref 3.5–5.3)
SODIUM: 140 meq/L (ref 135–145)
TOTAL PROTEIN: 6.8 g/dL (ref 6.0–8.3)
Total Bilirubin: 0.7 mg/dL (ref 0.2–1.2)

## 2013-05-27 LAB — HEPATITIS PANEL, ACUTE
HCV Ab: NEGATIVE
HEP A IGM: NONREACTIVE
HEP B S AG: NEGATIVE
Hep B C IgM: NONREACTIVE

## 2013-05-27 LAB — KOH PREP

## 2013-05-28 ENCOUNTER — Other Ambulatory Visit: Payer: Self-pay | Admitting: Physician Assistant

## 2013-05-28 MED ORDER — FLUCONAZOLE 150 MG PO TABS: ORAL_TABLET | ORAL | Status: DC

## 2013-07-29 ENCOUNTER — Other Ambulatory Visit: Payer: Self-pay | Admitting: *Deleted

## 2013-07-29 MED ORDER — LISINOPRIL-HYDROCHLOROTHIAZIDE 20-25 MG PO TABS: 1.0000 | ORAL_TABLET | Freq: Every day | ORAL | Status: DC

## 2013-08-21 ENCOUNTER — Ambulatory Visit (INDEPENDENT_AMBULATORY_CARE_PROVIDER_SITE_OTHER): Payer: Medicare HMO | Admitting: Family Medicine

## 2013-08-21 ENCOUNTER — Encounter: Payer: Self-pay | Admitting: Family Medicine

## 2013-08-21 VITALS — BP 101/66 | HR 60 | Ht 66.0 in | Wt 168.0 lb

## 2013-08-21 DIAGNOSIS — I10 Essential (primary) hypertension: Secondary | ICD-10-CM

## 2013-08-21 NOTE — Progress Notes (Signed)
   Subjective:    Patient ID: Brad Cannon, male    DOB: [birth year], 68 y.o.   MRN: [id number]  HPI Hypertension- Pt denies chest pain, SOB, dizziness, or heart palpitations.  Taking meds as directed w/o problems.  Denies medication side effects.     Review of Systems     Objective:   Physical Exam  Constitutional: He is oriented to person, place, and time. He appears well-developed and well-nourished.  HENT:  Head: Normocephalic and atraumatic.  Cardiovascular: Normal rate, regular rhythm and normal heart sounds.   Pulmonary/Chest: Effort normal and breath sounds normal.  Neurological: He is alert and oriented to person, place, and time.  Skin: Skin is warm and dry.  Psychiatric: He has a normal mood and affect. His behavior is normal.          Assessment & Plan:  HTN- well controlled, in fact his blood pressures actually little bit low today. I would like him to start splitting his lisinopril HCT. He does have a blood pressure cuff at home and will monitor her home. Goal is to keep systolic blood pressure under 027 and diastolic blood pressure under 90. As long as he is meeting those goals we can continue with half a tab and see him back in 6 months. If he has any problems when he can make further adjustments and he will give the office a call.

## 2013-09-30 ENCOUNTER — Encounter: Payer: Self-pay | Admitting: Physician Assistant

## 2013-09-30 ENCOUNTER — Ambulatory Visit (INDEPENDENT_AMBULATORY_CARE_PROVIDER_SITE_OTHER): Payer: Medicare HMO | Admitting: Physician Assistant

## 2013-09-30 VITALS — BP 125/74 | HR 77 | Ht 66.0 in | Wt 171.0 lb

## 2013-09-30 DIAGNOSIS — H65191 Other acute nonsuppurative otitis media, right ear: Secondary | ICD-10-CM

## 2013-09-30 DIAGNOSIS — H60391 Other infective otitis externa, right ear: Secondary | ICD-10-CM

## 2013-09-30 DIAGNOSIS — H65199 Other acute nonsuppurative otitis media, unspecified ear: Secondary | ICD-10-CM

## 2013-09-30 DIAGNOSIS — H60399 Other infective otitis externa, unspecified ear: Secondary | ICD-10-CM

## 2013-09-30 MED ORDER — NEOMYCIN-POLYMYXIN-HC 3.5-10000-1 OT SOLN: 4.0000 [drp] | Freq: Three times a day (TID) | OTIC | Status: DC

## 2013-09-30 MED ORDER — AZITHROMYCIN 250 MG PO TABS
ORAL_TABLET | ORAL | Status: DC
Start: 2013-09-30 — End: 2013-10-16

## 2013-09-30 NOTE — Patient Instructions (Signed)
Otitis Externa Otitis externa is a bacterial or fungal infection of the outer ear canal. This is the area from the eardrum to the outside of the ear. Otitis externa is sometimes called "swimmer's ear." CAUSES  Possible causes of infection include:  Swimming in dirty water.  Moisture remaining in the ear after swimming or bathing.  Mild injury (trauma) to the ear.  Objects stuck in the ear (foreign body).  Cuts or scrapes (abrasions) on the outside of the ear. SYMPTOMS  The first symptom of infection is often itching in the ear canal. Later signs and symptoms may include swelling and redness of the ear canal, ear pain, and yellowish-white fluid (pus) coming from the ear. The ear pain may be worse when pulling on the earlobe. DIAGNOSIS  Your caregiver will perform a physical exam. A sample of fluid may be taken from the ear and examined for bacteria or fungi. TREATMENT  Antibiotic ear drops are often given for 10 to 14 days. Treatment may also include pain medicine or corticosteroids to reduce itching and swelling. PREVENTION   Keep your ear dry. Use the corner of a towel to absorb water out of the ear canal after swimming or bathing.  Avoid scratching or putting objects inside your ear. This can damage the ear canal or remove the protective wax that lines the canal. This makes it easier for bacteria and fungi to grow.  Avoid swimming in lakes, polluted water, or poorly chlorinated pools.  You may use ear drops made of rubbing alcohol and vinegar after swimming. Combine equal parts of white vinegar and alcohol in a bottle. Put 3 or 4 drops into each ear after swimming. HOME CARE INSTRUCTIONS   Apply antibiotic ear drops to the ear canal as prescribed by your caregiver.  Only take over-the-counter or prescription medicines for pain, discomfort, or fever as directed by your caregiver.  If you have diabetes, follow any additional treatment instructions from your caregiver.  Keep all  follow-up appointments as directed by your caregiver. SEEK MEDICAL CARE IF:   You have a fever.  Your ear is still red, swollen, painful, or draining pus after 3 days.  Your redness, swelling, or pain gets worse.  You have a severe headache.  You have redness, swelling, pain, or tenderness in the area behind your ear. MAKE SURE YOU:   Understand these instructions.  Will watch your condition.  Will get help right away if you are not doing well or get worse. Document Released: 03/20/2005 Document Revised: 06/12/2011 Document Reviewed: 04/06/2011 [facility] Patient Information 2015 [place], [place]. This information is not intended to replace advice given to you by your health care provider. Make sure you discuss any questions you have with your health care provider.

## 2013-09-30 NOTE — Progress Notes (Signed)
   Subjective:    Patient ID: Brad Cannon, male    DOB: 02-Oct-1945, 68 y.o.   MRN: [id number]  HPI Pt presents to the clinic with hearing loss in right ear with congestion feeling. No fever, chills, n/v/d. No sinus pressure, ST or ear pain. Not tried anything to make better. Has been swimming a lot last week. He does feel like he has notice some drainage out of his right ear that has been subtle.    Review of Systems  All other systems reviewed and are negative.      Objective:   Physical Exam  Constitutional: He appears well-developed and well-nourished.  HENT:  Head: Normocephalic and atraumatic.  Nose: Nose normal.  Mouth/Throat: Oropharynx is clear and moist. No oropharyngeal exudate.  NO tenderness over tragus or with palpation of ear.  No gross hearing loss.  Some cerumen impaction was removed from right ear via irrigation.  TM dull with erythema visible. Pus obstructing view of TM on right side.   TM left clear, no blood or pus visible.   Negative for any maxillary or frontal sinus tenderness.   Eyes: Conjunctivae are normal. Right eye exhibits no discharge. Left eye exhibits no discharge.  Neck: Normal range of motion. Neck supple.  Cardiovascular: Normal rate, regular rhythm and normal heart sounds.   Pulmonary/Chest: Effort normal. He has no wheezes.  Lymphadenopathy:    He has no cervical adenopathy.  Skin: Skin is dry.  Psychiatric: He has a normal mood and affect. His behavior is normal.          Assessment & Plan:  Otitis externa/media right ear- treated with zpak per pt due to amoxil giving diarrhea. corticosporin drops were given for  7 days. Discussed with pt since not having significant symptoms I would like for him to come in next 7 days for recheck of ear to make sure healing. No significant gross hearing loss today. Take abx with food. Call if symptoms changing or worsening.

## 2013-10-14 ENCOUNTER — Telehealth: Payer: Self-pay

## 2013-10-14 NOTE — Telephone Encounter (Signed)
Can put on nurse schedule and I will take look to make sure has cleared up.

## 2013-10-14 NOTE — Telephone Encounter (Signed)
Mr. Wach called today and stated that he had an ear infection and was told to "just swing by the office and have it look at". I want to see if he needs an appointment or if he can just swing by. Please advise./Kelvin Sennett,CMA

## 2013-10-15 NOTE — Telephone Encounter (Signed)
LMOM with the information./Revanth Neidig,CMA

## 2013-10-16 ENCOUNTER — Ambulatory Visit (INDEPENDENT_AMBULATORY_CARE_PROVIDER_SITE_OTHER): Payer: Medicare HMO | Admitting: Family Medicine

## 2013-10-16 ENCOUNTER — Encounter: Payer: Self-pay | Admitting: Family Medicine

## 2013-10-16 VITALS — BP 113/70 | HR 64 | Ht 66.0 in | Wt 172.0 lb

## 2013-10-16 DIAGNOSIS — H65199 Other acute nonsuppurative otitis media, unspecified ear: Secondary | ICD-10-CM

## 2013-10-16 DIAGNOSIS — F458 Other somatoform disorders: Secondary | ICD-10-CM

## 2013-10-16 DIAGNOSIS — R0989 Other specified symptoms and signs involving the circulatory and respiratory systems: Secondary | ICD-10-CM

## 2013-10-16 DIAGNOSIS — H65191 Other acute nonsuppurative otitis media, right ear: Secondary | ICD-10-CM

## 2013-10-16 NOTE — Progress Notes (Signed)
   Subjective:    Patient ID: Brad Cannon, male    DOB: 09-Sep-1945, 68 y.o.   MRN: [id number]  HPI Followup right otitis media and externa-he says he feels completely better 100%. No fever chills or sweats. No discharge from the ear. He did he did complete his antibiotics.  He's also noticed a pressure sensation in the middle of his throat. It's not painful he says notices over the last several weeks and hasn't really gone away. No change with eating or swallowing. He's not actually getting food stuck. No chart choking or coughing. No prior history of thyroid problems.   Review of Systems     Objective:   Physical Exam  Constitutional: He appears well-developed and well-nourished.  HENT:  Head: Normocephalic and atraumatic.  Right Ear: External ear normal.  Left Ear: External ear normal.  TMs and canals are clear bilaterally. There still a little bit of dullness on the right tympanic membrane. Good light reflexes not as sharp it is present. There is a little bit of thickening scar tissue on the membrane as well.  Neck: Neck supple.  Nontender over the thyroid gland itself. I do feel like there's possibly a slight nodule on the left side but otherwise symmetric. No cervical lymphadenopathy.  Lymphadenopathy:    He has no cervical adenopathy.  Neurological: He is alert.  Skin: Skin is warm and dry.  Psychiatric: He has a normal mood and affect. His behavior is normal.          Assessment & Plan:  Right otitis media-resolved. Call if any recurrent symptoms or problems.  Globus sensation-could be related to the thyroid gland. I do feel like there's possibly a nodule on the left side. Would like to move forward with an ultrasound for further evaluation. It doesn't sound like an esophageal stricture. If suddenly getting worse or actually painful then please let me know. Also call if any fevers chills or sweats. He's not tender over the thyroid gland.

## 2013-10-22 ENCOUNTER — Ambulatory Visit (INDEPENDENT_AMBULATORY_CARE_PROVIDER_SITE_OTHER): Payer: Medicare HMO

## 2013-10-22 DIAGNOSIS — R9389 Abnormal findings on diagnostic imaging of other specified body structures: Secondary | ICD-10-CM

## 2013-10-22 DIAGNOSIS — E049 Nontoxic goiter, unspecified: Secondary | ICD-10-CM

## 2013-10-22 DIAGNOSIS — R0989 Other specified symptoms and signs involving the circulatory and respiratory systems: Secondary | ICD-10-CM

## 2013-10-23 ENCOUNTER — Other Ambulatory Visit: Payer: Self-pay | Admitting: Family Medicine

## 2013-10-23 DIAGNOSIS — E041 Nontoxic single thyroid nodule: Secondary | ICD-10-CM

## 2013-11-11 ENCOUNTER — Other Ambulatory Visit: Payer: Self-pay | Admitting: Family Medicine

## 2013-11-11 ENCOUNTER — Ambulatory Visit
Admission: RE | Admit: 2013-11-11 | Discharge: 2013-11-11 | Disposition: A | Discharge status: Home / Self Care | Payer: Commercial Managed Care - HMO | Source: Ambulatory Visit | Attending: Family Medicine | Admitting: Family Medicine

## 2013-11-11 DIAGNOSIS — E041 Nontoxic single thyroid nodule: Secondary | ICD-10-CM

## 2013-11-12 ENCOUNTER — Other Ambulatory Visit: Payer: Medicare HMO

## 2013-12-25 ENCOUNTER — Ambulatory Visit (INDEPENDENT_AMBULATORY_CARE_PROVIDER_SITE_OTHER): Payer: Commercial Managed Care - HMO | Admitting: Family Medicine

## 2013-12-25 VITALS — Temp 98.2°F

## 2013-12-25 DIAGNOSIS — Z23 Encounter for immunization: Secondary | ICD-10-CM

## 2013-12-25 NOTE — Progress Notes (Signed)
   Subjective:    Patient ID: Brad Cannon, male    DOB: January 21, 1946, 68 y.o.   MRN: [id number]  HPI [place] reported today for flu shot. Patient denies being sick and has not had a fever or chills. Margette Fast, CMA    Review of Systems     Objective:   Physical Exam        Assessment & Plan:

## 2014-02-18 ENCOUNTER — Encounter: Payer: Self-pay | Admitting: Family Medicine

## 2014-02-18 ENCOUNTER — Ambulatory Visit (INDEPENDENT_AMBULATORY_CARE_PROVIDER_SITE_OTHER): Payer: Commercial Managed Care - HMO | Admitting: Family Medicine

## 2014-02-18 VITALS — BP 90/56 | HR 80 | Temp 97.8°F | Ht 66.0 in | Wt 172.0 lb

## 2014-02-18 DIAGNOSIS — E785 Hyperlipidemia, unspecified: Secondary | ICD-10-CM

## 2014-02-18 DIAGNOSIS — Z23 Encounter for immunization: Secondary | ICD-10-CM

## 2014-02-18 DIAGNOSIS — R7309 Other abnormal glucose: Secondary | ICD-10-CM

## 2014-02-18 DIAGNOSIS — I10 Essential (primary) hypertension: Secondary | ICD-10-CM

## 2014-02-18 LAB — POCT GLYCOSYLATED HEMOGLOBIN (HGB A1C): HEMOGLOBIN A1C: 5.5

## 2014-02-18 MED ORDER — LISINOPRIL-HYDROCHLOROTHIAZIDE 10-12.5 MG PO TABS: 1.0000 | ORAL_TABLET | Freq: Every day | ORAL | Status: DC

## 2014-02-18 MED ORDER — SIMVASTATIN 40 MG PO TABS: ORAL_TABLET | ORAL | Status: DC

## 2014-02-18 NOTE — Progress Notes (Signed)
   Subjective:    Patient ID: Brad Cannon, male    DOB: 1946-01-23, 68 y.o.   MRN: [id number]  HPI  Hypertension- Pt denies chest pain, SOB, dizziness, or heart palpitations.  Taking meds as directed w/o problems.  Denies medication side effects.  Home BPs running in the 1teens. Hehas been exercising regularly.    Hyperlipidemia - On simvastatin regularly w/out S.E.  Lab Results  Component Value Date   CHOL 119 02/21/2013   HDL 39* 02/21/2013   LDLCALC 68 02/21/2013   TRIG 61 02/21/2013   CHOLHDL 3.1 02/21/2013     Review of Systems     Objective:   Physical Exam  Constitutional: He is oriented to person, place, and time. He appears well-developed and well-nourished.  HENT:  Head: Normocephalic and atraumatic.  Cardiovascular: Normal rate, regular rhythm and normal heart sounds.   Pulmonary/Chest: Effort normal and breath sounds normal.  Neurological: He is alert and oriented to person, place, and time.  Skin: Skin is warm and dry.  Psychiatric: He has a normal mood and affect. His behavior is normal.          Assessment & Plan:  HTN - well-controlled. In fact blood pressures little bit later today. Very good workout and exercise this morning. He specifically is high blood pressures have looked great. He's not feeling lightheaded or dizzy or weak or sluggish and has been able to work out of full capacity. Continue current regimen. Follow-up in 6 months. Due for CMP and lipids  Hyperlipidemia - Due for lipid recheck.   Discussed Prevnar. Given today.

## 2014-02-19 LAB — LIPID PANEL
Cholesterol: 136 mg/dL (ref 0–200)
HDL: 44 mg/dL (ref >39)
LDL Cholesterol: 78 mg/dL (ref 0–99)
Total CHOL/HDL Ratio: 3.1 Ratio
Triglycerides: 70 mg/dL (ref <150)
VLDL: 14 mg/dL (ref 0–40)

## 2014-02-19 LAB — BASIC METABOLIC PANEL WITH GFR
BUN: 22 mg/dL (ref 6–23)
CO2: 22 mEq/L (ref 19–32)
Calcium: 9.7 mg/dL (ref 8.4–10.5)
Chloride: 102 mEq/L (ref 96–112)
Creat: 1.06 mg/dL (ref 0.50–1.35)
GFR, Est African American: 82 mL/min
GFR, Est Non African American: 71 mL/min
Glucose, Bld: 95 mg/dL (ref 70–99)
Potassium: 4.3 mEq/L (ref 3.5–5.3)
Sodium: 136 mEq/L (ref 135–145)

## 2014-02-20 NOTE — Progress Notes (Signed)
Quick Note:  All labs are normal. ______ 

## 2014-02-24 ENCOUNTER — Ambulatory Visit: Payer: Medicare HMO | Admitting: Family Medicine

## 2014-03-11 ENCOUNTER — Telehealth: Payer: Self-pay | Admitting: *Deleted

## 2014-03-11 DIAGNOSIS — I10 Essential (primary) hypertension: Secondary | ICD-10-CM

## 2014-03-11 DIAGNOSIS — E785 Hyperlipidemia, unspecified: Secondary | ICD-10-CM

## 2014-03-11 MED ORDER — SIMVASTATIN 40 MG PO TABS: ORAL_TABLET | ORAL | Status: DC

## 2014-03-11 MED ORDER — LISINOPRIL-HYDROCHLOROTHIAZIDE 10-12.5 MG PO TABS: 1.0000 | ORAL_TABLET | Freq: Every day | ORAL | Status: DC

## 2014-03-11 NOTE — Telephone Encounter (Signed)
Pt called and asked that his rx for cholesterol and bp be sent to [facility].Audelia Hives [place]

## 2014-05-06 ENCOUNTER — Ambulatory Visit (INDEPENDENT_AMBULATORY_CARE_PROVIDER_SITE_OTHER): Payer: Commercial Managed Care - HMO | Admitting: Physician Assistant

## 2014-05-06 ENCOUNTER — Encounter: Payer: Self-pay | Admitting: Physician Assistant

## 2014-05-06 VITALS — BP 110/77 | HR 61 | Ht 66.0 in | Wt 174.0 lb

## 2014-05-06 DIAGNOSIS — H6123 Impacted cerumen, bilateral: Secondary | ICD-10-CM | POA: Diagnosis not present

## 2014-05-06 DIAGNOSIS — H6091 Unspecified otitis externa, right ear: Secondary | ICD-10-CM

## 2014-05-06 DIAGNOSIS — R0981 Nasal congestion: Secondary | ICD-10-CM | POA: Diagnosis not present

## 2014-05-06 DIAGNOSIS — H6120 Impacted cerumen, unspecified ear: Secondary | ICD-10-CM | POA: Insufficient documentation

## 2014-05-06 MED ORDER — NEOMYCIN-POLYMYXIN-HC 3.5-10000-1 OT SOLN: 4.0000 [drp] | Freq: Four times a day (QID) | OTIC | Status: DC

## 2014-05-06 NOTE — Progress Notes (Signed)
   Subjective:    Patient ID: Brad Cannon, male    DOB: 25-Dec-1945, 69 y.o.   MRN: [id number]  HPI Pt presents to the clinic with bilateral ears feeling clogged. Some hearing loss. Hx of cerumen impaction. Also having some nasal congestion. Feeling "stuffy". Not tried anything to make better. Denies any fever, chills, ear pain, ST.    Review of Systems  All other systems reviewed and are negative.      Objective:   Physical Exam  Constitutional: He is oriented to person, place, and time. He appears well-developed and well-nourished.  HENT:  Head: Normocephalic and atraumatic.  Nose: Nose normal.  Mouth/Throat: Oropharynx is clear and moist.  Bilateral cerumen impaction. Nurse irrigated.  Left external and internal canal clear.  Right erythematous with some whitish debri in canal.  [place] viewed. No buldging of TM.     Eyes: Conjunctivae are normal. Right eye exhibits no discharge. Left eye exhibits no discharge.  Neck: Normal range of motion. Neck supple.  Cardiovascular: Normal rate, regular rhythm and normal heart sounds.   Pulmonary/Chest: Effort normal and breath sounds normal. He has no wheezes.  Lymphadenopathy:    He has no cervical adenopathy.  Neurological: He is alert and oriented to person, place, and time.  Skin: Skin is dry.  Psychiatric: He has a normal mood and affect. His behavior is normal.          Assessment & Plan:  Cerumen impaction, bilateral- resolved with irrigation.   Right otitis externa- cortisporin for 10 days given. Follow up as needed.   Nasal congestion- suggested flonase daily and mucinex for next couple of days. If not improving call office.

## 2014-05-12 ENCOUNTER — Ambulatory Visit: Payer: Commercial Managed Care - HMO | Admitting: Family Medicine

## 2014-07-16 ENCOUNTER — Encounter: Payer: Self-pay | Admitting: Family Medicine

## 2014-07-16 ENCOUNTER — Ambulatory Visit (INDEPENDENT_AMBULATORY_CARE_PROVIDER_SITE_OTHER): Payer: Commercial Managed Care - HMO | Admitting: Family Medicine

## 2014-07-16 VITALS — BP 132/84 | HR 67 | Temp 97.8°F | Wt 177.0 lb

## 2014-07-16 DIAGNOSIS — J329 Chronic sinusitis, unspecified: Secondary | ICD-10-CM

## 2014-07-16 DIAGNOSIS — B9689 Other specified bacterial agents as the cause of diseases classified elsewhere: Secondary | ICD-10-CM

## 2014-07-16 DIAGNOSIS — J00 Acute nasopharyngitis [common cold]: Secondary | ICD-10-CM | POA: Diagnosis not present

## 2014-07-16 DIAGNOSIS — L989 Disorder of the skin and subcutaneous tissue, unspecified: Secondary | ICD-10-CM

## 2014-07-16 DIAGNOSIS — A499 Bacterial infection, unspecified: Secondary | ICD-10-CM

## 2014-07-16 DIAGNOSIS — L570 Actinic keratosis: Secondary | ICD-10-CM | POA: Diagnosis not present

## 2014-07-16 DIAGNOSIS — J208 Acute bronchitis due to other specified organisms: Secondary | ICD-10-CM

## 2014-07-16 MED ORDER — AMOXICILLIN-POT CLAVULANATE 500-125 MG PO TABS: ORAL_TABLET | ORAL | Status: AC

## 2014-07-16 NOTE — Progress Notes (Signed)
CC: Brad Cannon is a 69 y.o. male is here for cough and congestion   Subjective: HPI:  Thursday of last week began to have productive cough, nasal congestion, facial pressure and sore throat. Sore throat has resolved however all other symptoms remain with shortness of breath that developed over the past few days but only with exertion. Slight improvement with above symptoms with Mucinex no other interventions as of yet. Symptoms are moderate in severity and present all hours of the day but not interfering with sleep. Multiple sick contacts while on a cruise last week. Reports fevers and chills earlier this week but no objective temperatures. Denies chest pain, confusion, rapid heartbeat, rash, difficult to swallowing nor headache.  Skin lesion on his scalp that has been present for a few months. Interventions have included cleaning with alcohol on almost daily basis. He tells me alcohol causes a small scale in the center to disappear only to reappear a few hours. Painless. It's been growing on a monthly basis. He tells me he had a lesion frozen a few years ago near this site for concerns of actinic keratosis. There's been no unintentional weight loss or gain or swollen lymph nodes.   Review Of Systems Outlined In HPI  Past Medical History  Diagnosis Date  . Hepatitis A 1998    with elevated LFT's  . Hyperlipidemia   . Hypertension   . IFG (impaired fasting glucose)   . AAA (abdominal aortic aneurysm)     Past Surgical History  Procedure Laterality Date  . Arthroscopic surgery  1982    left knee   Family History  Problem Relation Age of Onset  . Hypertension Mother   . Hyperlipidemia Mother   . Heart disease Father   . Hypertension Sister   . Hyperlipidemia Sister   . Hyperlipidemia Brother   . Hypertension Brother     History   Social History  . Marital Status: Married    Spouse Name: N/A  . Number of Children: N/A  . Years of Education: N/A   Occupational History   . Not on file.   Social History Main Topics  . Smoking status: Never Smoker   . Smokeless tobacco: Never Used  . Alcohol Use: Yes  . Drug Use: No  . Sexual Activity: Not on file   Other Topics Concern  . Not on file   Social History Narrative   Works out regularly.  No caffeine.       Objective: BP 132/84 mmHg  Pulse 67  Temp(Src) 97.8 F (36.6 C) (Oral)  Wt 177 lb (80.287 kg)  SpO2 96%  General: Alert and Oriented, No Acute Distress HEENT: Pupils equal, round, reactive to light. Conjunctivae clear.  External ears unremarkable, canals clear with intact TMs with appropriate landmarks.  Middle ear appears to have a serous effusion bilaterally. Pink inferior turbinates.  Moist mucous membranes, pharynx without inflammation nor lesions.  Neck supple without palpable lymphadenopathy nor abnormal masses. Lungs: Clear to auscultation bilaterally, no wheezing/ronchi/rales.  Comfortable work of breathing. Good air movement. Cardiac: Regular rate and rhythm. Normal S1/S2.  No murmurs, rubs, nor gallops.   Mental Status: No depression, anxiety, nor agitation. Skin: Warm and dry. 7 mm diameter dome-shaped fleshy lesion on the scalp with a central 3 cm diameter scale.  Assessment & Plan: Brad Cannon was seen today for cough and congestion.  Diagnoses and all orders for this visit:  Bacterial sinusitis Orders: -     amoxicillin-clavulanate (AUGMENTIN) 500-125 MG per  tablet; Take one by mouth every 8 hours for ten total days.  Acute bacterial bronchitis Orders: -     amoxicillin-clavulanate (AUGMENTIN) 500-125 MG per tablet; Take one by mouth every 8 hours for ten total days.  Skin lesion of scalp Orders: -     Dermatology pathology  Other orders -     Cancel: amoxicillin-clavulanate (AUGMENTIN) 875-125 MG per tablet; Take 1 tablet by mouth 2 (two) times daily.   Atrial sinusitis and bronchitis: Start Augmentin, he declines any medication for cough suppression. Skin lesion of the  scalp: I suspicion is that this is a basal cell carcinoma however I'm not 100% confident at this time, shave biopsy was performed with patient's permission.  Shave Biopsy Procedure Note  Pre-operative Diagnosis: Suspicious lesion  Post-operative Diagnosis: same  Locations: scalp  Indications: rule out BCC  Anesthesia: Lidocaine 1% with epinephrine   Procedure Details  History of allergy to iodine: no  Patient informed of the risks (including bleeding and infection) and benefits of the  procedure and Verbal informed consent obtained.  The lesion and surrounding area were given a sterile prep using alcohol and draped in the usual sterile fashion. A scalpel was used to shave an area of skin approximately 63mm by 42mm.  Hemostasis achieved with pressure. Antibiotic ointment and a sterile dressing applied.  The specimen was sent for pathologic examination. The patient tolerated the procedure well.  EBL: 1 ml  Findings: unremarkable  Condition: Stable  Complications: none.  Plan: 1. Instructed to keep the wound dry and covered for 24-48h and clean thereafter. 2. Warning signs of infection were reviewed.   3. Recommended that the patient use OTC analgesics as needed for pain.  4. Return PRN  Return if symptoms worsen or fail to improve.

## 2014-07-23 ENCOUNTER — Telehealth: Payer: Self-pay | Admitting: Family Medicine

## 2014-07-23 DIAGNOSIS — L570 Actinic keratosis: Secondary | ICD-10-CM

## 2014-07-23 MED ORDER — FLUOROURACIL 5 % EX CREA: TOPICAL_CREAM | CUTANEOUS | Status: DC

## 2014-07-23 NOTE — Telephone Encounter (Signed)
Seth Bake, Will you please let patient know that his biopsy showed a pre-cancerous lesion with no other signs of cancer.  Since this has already once been treated with freezing I'd recommend he use a topical agent that will kill the remaining pre-cancerous cells.  I've sent a Rx of this cream to his [facility].  It will cause some ulceration at the site of application so only use a small dab at the site of the lesion.

## 2014-07-23 NOTE — Telephone Encounter (Signed)
Left message on vm

## 2014-07-24 ENCOUNTER — Telehealth: Payer: Self-pay | Admitting: *Deleted

## 2014-07-24 NOTE — Telephone Encounter (Signed)
Pt called and states the cream for his precancerous cells was $157. He has an appt with Dr. Madilyn Fireman next week and wanted to know if the spot on his head can be frozen.( pt has already had the area frozen and this is why Dr. Ileene Rubens rx'd the cream) please advise

## 2014-07-24 NOTE — Telephone Encounter (Signed)
Yes, we can repeat the cryotherapy.

## 2014-07-27 NOTE — Telephone Encounter (Signed)
Pt.notified

## 2014-08-04 ENCOUNTER — Inpatient Hospital Stay (HOSPITAL_COMMUNITY)
Admission: RE | Admit: 2014-08-04 | Discharge: 2014-08-04 | Disposition: A | Discharge status: Home / Self Care | Payer: Commercial Managed Care - HMO | Source: Ambulatory Visit | Attending: Vascular Surgery | Admitting: Vascular Surgery

## 2014-08-04 ENCOUNTER — Telehealth: Payer: Self-pay | Admitting: *Deleted

## 2014-08-04 ENCOUNTER — Ambulatory Visit: Payer: BC Managed Care – PPO | Admitting: Vascular Surgery

## 2014-08-04 DIAGNOSIS — I714 Abdominal aortic aneurysm, without rupture, unspecified: Secondary | ICD-10-CM

## 2014-08-04 NOTE — Telephone Encounter (Signed)
Referral placed.

## 2014-08-18 ENCOUNTER — Other Ambulatory Visit (HOSPITAL_COMMUNITY): Payer: Commercial Managed Care - HMO

## 2014-08-18 ENCOUNTER — Ambulatory Visit: Payer: Commercial Managed Care - HMO | Admitting: Vascular Surgery

## 2014-08-20 ENCOUNTER — Ambulatory Visit (INDEPENDENT_AMBULATORY_CARE_PROVIDER_SITE_OTHER): Payer: Commercial Managed Care - HMO | Admitting: Family Medicine

## 2014-08-20 ENCOUNTER — Encounter: Payer: Self-pay | Admitting: Family Medicine

## 2014-08-20 VITALS — BP 117/79 | HR 66 | Ht 66.0 in | Wt 177.0 lb

## 2014-08-20 DIAGNOSIS — L570 Actinic keratosis: Secondary | ICD-10-CM

## 2014-08-20 DIAGNOSIS — I10 Essential (primary) hypertension: Secondary | ICD-10-CM | POA: Diagnosis not present

## 2014-08-20 LAB — BASIC METABOLIC PANEL
BUN: 16 mg/dL (ref 6–23)
CALCIUM: 9.7 mg/dL (ref 8.4–10.5)
CO2: 27 meq/L (ref 19–32)
CREATININE: 0.81 mg/dL (ref 0.50–1.35)
Chloride: 101 mEq/L (ref 96–112)
Glucose, Bld: 100 mg/dL — ABNORMAL HIGH (ref 70–99)
Potassium: 4.3 mEq/L (ref 3.5–5.3)
Sodium: 138 mEq/L (ref 135–145)

## 2014-08-20 NOTE — Progress Notes (Signed)
   Subjective:    Patient ID: Brad Cannon, male    DOB: 25-Oct-1945, 69 y.o.   MRN: [id number]  HPI Hypertension- Pt denies chest pain, SOB, dizziness, or heart palpitations.  Taking meds as directed w/o problems.  Denies medication side effects.  Walking for exercise.   AKs on the scalp and wanted them frozen today.     Review of Systems     Objective:   Physical Exam        Assessment & Plan:  HTN- well controlled. Due for BMP.  F/U in 6 months.    AK - cryotherapy performed.    Cryotherapy Procedure Note  Pre-operative Diagnosis: Actinic keratosis  Post-operative Diagnosis: Actinic keratosis  Locations: top posterior scalp  Indications: none  Anesthesia: not required    Procedure Details  Patient informed of risks (permanent scarring, infection, light or dark discoloration, bleeding, infection, weakness, numbness and recurrence of the lesion) and benefits of the procedure and verbal informed consent obtained.  The areas are treated with liquid nitrogen therapy, frozen until ice ball extended 2 mm beyond lesion, allowed to thaw, and treated again. The patient tolerated procedure well.  The patient was instructed on post-op care, warned that there may be blister formation, redness and pain. Recommend OTC analgesia as needed for pain.  Condition: Stable    Complications: none.  Plan: 1. Instructed to keep the area dry and covered for 24-48h and clean thereafter. 2. Warning signs of infection were reviewed.   3. Recommended that the patient use OTC acetaminophen as needed for pain.  4. Return PRN.

## 2014-08-21 NOTE — Progress Notes (Signed)
Quick Note:  All labs are normal. ______ 

## 2014-09-07 ENCOUNTER — Other Ambulatory Visit: Payer: Self-pay | Admitting: Vascular Surgery

## 2014-09-07 ENCOUNTER — Encounter: Payer: Self-pay | Admitting: Vascular Surgery

## 2014-09-07 DIAGNOSIS — I714 Abdominal aortic aneurysm, without rupture, unspecified: Secondary | ICD-10-CM

## 2014-09-08 ENCOUNTER — Ambulatory Visit (INDEPENDENT_AMBULATORY_CARE_PROVIDER_SITE_OTHER): Payer: Commercial Managed Care - HMO | Admitting: Vascular Surgery

## 2014-09-08 ENCOUNTER — Other Ambulatory Visit: Payer: Self-pay | Admitting: Vascular Surgery

## 2014-09-08 ENCOUNTER — Other Ambulatory Visit (HOSPITAL_COMMUNITY): Payer: Commercial Managed Care - HMO

## 2014-09-08 ENCOUNTER — Encounter: Payer: Self-pay | Admitting: Vascular Surgery

## 2014-09-08 ENCOUNTER — Ambulatory Visit (HOSPITAL_COMMUNITY)
Admission: RE | Admit: 2014-09-08 | Discharge: 2014-09-08 | Disposition: A | Discharge status: Home / Self Care | Payer: Commercial Managed Care - HMO | Source: Ambulatory Visit | Attending: Vascular Surgery | Admitting: Vascular Surgery

## 2014-09-08 ENCOUNTER — Ambulatory Visit: Payer: Commercial Managed Care - HMO | Admitting: Vascular Surgery

## 2014-09-08 VITALS — BP 127/82 | HR 63 | Resp 18 | Ht 66.0 in | Wt 177.0 lb

## 2014-09-08 DIAGNOSIS — I77811 Abdominal aortic ectasia: Secondary | ICD-10-CM

## 2014-09-08 DIAGNOSIS — I714 Abdominal aortic aneurysm, without rupture, unspecified: Secondary | ICD-10-CM

## 2014-09-08 NOTE — Progress Notes (Signed)
Patient presents today for follow-up of his finding of abdominal ectasia 2 years ago. 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 He remains extremely healthy. He is active and has no major medical difficulties. He has no symptoms referable to his aneurysm  Past Medical History  Diagnosis Date  . Hepatitis A 1998    with elevated LFT's  . Hyperlipidemia   . Hypertension   . IFG (impaired fasting glucose)   . AAA (abdominal aortic aneurysm)     History  Substance Use Topics  . Smoking status: Never Smoker   . Smokeless tobacco: Never Used  . Alcohol Use: 0.0 oz/week    0 Standard drinks or equivalent per week    Family History  Problem Relation Age of Onset  . Hypertension Mother   . Hyperlipidemia Mother   . Heart disease Father   . Hyperlipidemia Father   . Hypertension Father   . Hypertension Sister   . Hyperlipidemia Sister   . Hyperlipidemia Brother   . Hypertension Brother   . Heart disease Brother     No Known Allergies   Current outpatient prescriptions:  .  aspirin 81 MG tablet, Take 81 mg by mouth daily., Disp: , Rfl:  .  fluticasone (FLONASE) 50 MCG/ACT nasal spray, Place 2 sprays into both nostrils daily., Disp: 16 g, Rfl: 2 .  lisinopril-hydrochlorothiazide (PRINZIDE,ZESTORETIC) 10-12.5 MG per tablet, Take 1 tablet by mouth daily., Disp: 90 tablet, Rfl: 3 .  simvastatin (ZOCOR) 40 MG tablet, TAKE 1 TABLET EVERY EVENING, Disp: 90 tablet, Rfl: 3  Filed Vitals:   09/08/14 0958  BP: 127/82  Pulse: 63  Resp: 18  Height: 5\' 6"  (1.676 m)  Weight: 177 lb (80.287 kg)    Body mass index is 28.58 kg/(m^2).       Physical exam: Well-developed well-nourished gentleman acute distress 2-3+ radial femoral popliteal and pedal pulses. No evidence of popliteal artery aneurysms by physical exam. Abdomen soft nontender. I do not feel an aneurysm.  He did undergo repeat duplex in our office today. This  shows no change with a maximal diameter of 2.6 cm in his infrarenal aorta which is unchanged from April 2014  I discussed this at length with patient. Explained that this puts him at no risk for rupture. I did explain this small ectasia in all likelihood this will never be a lifelong issue for him. I have recommended reimaging his aorta with ultrasound in 3 years. If 5 there is no change that time I would consider discontinuing follow-up. He is comfortable with this discussion will see [place] again in 3 years

## 2014-09-09 NOTE — Addendum Note (Signed)
Addended by: Dorthula Rue L on: 09/09/2014 05:22 PM   Modules accepted: Orders

## 2015-02-18 ENCOUNTER — Ambulatory Visit: Payer: Commercial Managed Care - HMO | Admitting: Family Medicine

## 2015-03-02 ENCOUNTER — Encounter: Payer: Self-pay | Admitting: Family Medicine

## 2015-03-02 ENCOUNTER — Ambulatory Visit (INDEPENDENT_AMBULATORY_CARE_PROVIDER_SITE_OTHER): Payer: Commercial Managed Care - HMO | Admitting: Family Medicine

## 2015-03-02 VITALS — BP 132/87 | HR 74 | Temp 97.7°F | Resp 18 | Wt 179.2 lb

## 2015-03-02 DIAGNOSIS — E785 Hyperlipidemia, unspecified: Secondary | ICD-10-CM

## 2015-03-02 DIAGNOSIS — I10 Essential (primary) hypertension: Secondary | ICD-10-CM

## 2015-03-02 MED ORDER — LISINOPRIL-HYDROCHLOROTHIAZIDE 10-12.5 MG PO TABS: 1.0000 | ORAL_TABLET | Freq: Every day | ORAL | Status: DC

## 2015-03-02 NOTE — Progress Notes (Signed)
   Subjective:    Patient ID: Brad Cannon, male    DOB: 1945/12/10, 69 y.o.   MRN: [id number]  HPI Hypertension- Pt denies chest pain, SOB, dizziness, or heart palpitations.  Taking meds as directed w/o problems.  Denies medication side effects.  He is taking his medication regularly and will need refills sent to his mail order pharmacy.  Hyperlipidemia-tolerating statin well without any side effects or problems.  Review of Systems     Objective:   Physical Exam  Constitutional: He is oriented to person, place, and time. He appears well-developed and well-nourished.  HENT:  Head: Normocephalic and atraumatic.  Cardiovascular: Normal rate, regular rhythm and normal heart sounds.   Pulmonary/Chest: Effort normal and breath sounds normal.  Neurological: He is alert and oriented to person, place, and time.  Skin: Skin is warm and dry.  Psychiatric: He has a normal mood and affect. His behavior is normal.          Assessment & Plan:  HTN - well controlled. Continue current regimen. Follow-up in 6 months. Due for CMP and fasting lipid panel.  Hyperlipidemia - due to recheck lipids. Will adjust dose if needed and then we'll need to send a prescription to mail order pharmacy, [facility].

## 2015-03-04 LAB — LIPID PANEL
Cholesterol: 138 mg/dL (ref 125–200)
HDL: 38 mg/dL — ABNORMAL LOW (ref >=40)
LDL CALC: 86 mg/dL (ref <130)
Total CHOL/HDL Ratio: 3.6 Ratio (ref <=5.0)
Triglycerides: 72 mg/dL (ref <150)
VLDL: 14 mg/dL (ref <30)

## 2015-03-04 LAB — COMPLETE METABOLIC PANEL WITH GFR
ALT: 17 U/L (ref 9–46)
AST: 43 U/L — ABNORMAL HIGH (ref 10–35)
Albumin: 4.2 g/dL (ref 3.6–5.1)
Alkaline Phosphatase: 53 U/L (ref 40–115)
BILIRUBIN TOTAL: 0.8 mg/dL (ref 0.2–1.2)
BUN: 19 mg/dL (ref 7–25)
CO2: 27 mmol/L (ref 20–31)
CREATININE: 0.93 mg/dL (ref 0.70–1.18)
Calcium: 9.2 mg/dL (ref 8.6–10.3)
Chloride: 103 mmol/L (ref 98–110)
GFR, Est African American: >89 mL/min (ref >=60)
GFR, Est Non African American: 83 mL/min (ref >=60)
GLUCOSE: 90 mg/dL (ref 65–99)
Potassium: 4.2 mmol/L (ref 3.5–5.3)
Sodium: 139 mmol/L (ref 135–146)
TOTAL PROTEIN: 7.3 g/dL (ref 6.1–8.1)

## 2015-03-09 ENCOUNTER — Other Ambulatory Visit: Payer: Self-pay | Admitting: *Deleted

## 2015-03-09 DIAGNOSIS — E785 Hyperlipidemia, unspecified: Secondary | ICD-10-CM

## 2015-03-09 MED ORDER — SIMVASTATIN 40 MG PO TABS: ORAL_TABLET | ORAL | Status: DC

## 2015-08-31 ENCOUNTER — Encounter: Payer: Self-pay | Admitting: Family Medicine

## 2015-08-31 ENCOUNTER — Ambulatory Visit (INDEPENDENT_AMBULATORY_CARE_PROVIDER_SITE_OTHER): Payer: Commercial Managed Care - HMO | Admitting: Family Medicine

## 2015-08-31 VITALS — BP 113/82 | HR 72 | Wt 178.0 lb

## 2015-08-31 DIAGNOSIS — Z125 Encounter for screening for malignant neoplasm of prostate: Secondary | ICD-10-CM | POA: Diagnosis not present

## 2015-08-31 DIAGNOSIS — I10 Essential (primary) hypertension: Secondary | ICD-10-CM

## 2015-08-31 DIAGNOSIS — IMO0001 Reserved for inherently not codable concepts without codable children: Secondary | ICD-10-CM

## 2015-08-31 DIAGNOSIS — R143 Flatulence: Secondary | ICD-10-CM

## 2015-08-31 LAB — BASIC METABOLIC PANEL
BUN: 18 mg/dL (ref 7–25)
CALCIUM: 9.2 mg/dL (ref 8.6–10.3)
CHLORIDE: 104 mmol/L (ref 98–110)
CO2: 25 mmol/L (ref 20–31)
Creat: 0.95 mg/dL (ref 0.70–1.18)
GLUCOSE: 95 mg/dL (ref 65–99)
POTASSIUM: 4 mmol/L (ref 3.5–5.3)
Sodium: 137 mmol/L (ref 135–146)

## 2015-08-31 NOTE — Progress Notes (Signed)
Subjective:    CC: HTN  HPI:  Hypertension- Pt denies chest pain, SOB, dizziness, or heart palpitations.  Taking meds as directed w/o problems.  Denies medication side effects.    The complains of excess gas over the last week and a half. He says he has very regular bowel movements. They typically occur about 30 minutes before he gets up first thing in the morning. He denies any major changes in diet or increase in fiber. That he has been eating a little bit more fruit than usual but nothing dramatically different. No changes in increase in dairy products. No blood in the stool. And no actual change her regularity of bowel movements.  Past medical history, Surgical history, Family history not pertinant except as noted below, Social history, Allergies, and medications have been entered into the medical record, reviewed, and corrections made.   Review of Systems: No fevers, chills, night sweats, weight loss, chest pain, or shortness of breath.   Objective:    General: Well Developed, well nourished, and in no acute distress.  Neuro: Alert and oriented x3, extra-ocular muscles intact, sensation grossly intact.  HEENT: Normocephalic, atraumatic  Skin: Warm and dry, no rashes. Cardiac: Regular rate and rhythm, no murmurs rubs or gallops, no lower extremity edema.  Respiratory: Clear to auscultation bilaterally. Not using accessory muscles, speaking in full sentences.   Impression and Recommendations:    HTN - Well controlled. Continue current regimen. Follow up in 6 mo. BMP today.   Gas/bloating-we discussed that dietary changes are often the trigger for this. Discussed watching his diet intake and see if there's been any major jump and fiber intake. Certainly if it persists or if he has any blood in the stool the, or develops abdominal pain then please let us know. Can also tried over-the-counter probiotic if symptoms persist.

## 2015-09-01 LAB — PSA: PSA: 1.66 ng/mL (ref <=4.00)

## 2015-09-01 NOTE — Progress Notes (Signed)
Quick Note:  All labs are normal. ______ 

## 2015-09-09 ENCOUNTER — Telehealth: Payer: Self-pay | Admitting: *Deleted

## 2015-09-09 NOTE — Telephone Encounter (Signed)
Results mailed to patient.Brad Cannon, Lahoma Crocker

## 2015-09-27 ENCOUNTER — Encounter: Payer: Self-pay | Admitting: Osteopathic Medicine

## 2015-09-27 ENCOUNTER — Ambulatory Visit (INDEPENDENT_AMBULATORY_CARE_PROVIDER_SITE_OTHER): Payer: Commercial Managed Care - HMO | Admitting: Osteopathic Medicine

## 2015-09-27 VITALS — BP 130/81 | HR 72 | Temp 97.6°F | Wt 176.0 lb

## 2015-09-27 DIAGNOSIS — L500 Allergic urticaria: Secondary | ICD-10-CM | POA: Diagnosis not present

## 2015-09-27 DIAGNOSIS — L259 Unspecified contact dermatitis, unspecified cause: Secondary | ICD-10-CM | POA: Diagnosis not present

## 2015-09-27 MED ORDER — SODIUM CHLORIDE 0.9 % IV SOLN: 80.0000 mg | Freq: Once | INTRAVENOUS | Status: DC

## 2015-09-27 MED ORDER — METHYLPREDNISOLONE SODIUM SUCC 125 MG IJ SOLR
125.0000 mg | Freq: Once | INTRAMUSCULAR | Status: AC
  Administered 2015-09-27: 125 mg via INTRAMUSCULAR

## 2015-09-27 MED ORDER — HYDROXYZINE HCL 25 MG PO TABS: 25.0000 mg | ORAL_TABLET | Freq: Three times a day (TID) | ORAL | Status: DC | PRN

## 2015-09-27 NOTE — Patient Instructions (Signed)
Most likely, this rash is due to allergic contact dermatitis with some sort of plant. We treat this with steroids to calm the inflammation/allergy reaction, and anti-itch medicines. Once the acute allergic reaction is over, your skin will begin to heal.   Less likely would be an infection due to a bacterial or a fungal problem, or a reaction to a bite of some kind. Some types of thorny plants carry bacteria which can cause dangerous infections, so we need to know right away if the area surrounding the rash is getting more red or painful, or if you delvelop fever, chills, severe fatigue, or other concerns.   To avoid this problem in the future, always wear long pants when working outside near plants which may cause irritation, and wear bug spray!   Please let us know if the rash gets worse or changes!

## 2015-09-27 NOTE — Progress Notes (Signed)
HPI: Brad Cannon is a 70 y.o. male who presents to [place] today for chief complaint of:  Chief Complaint  Patient presents with  . Rash     . Context: Possible exposure while up in the yard . Location: Worse on left leg, some present on medial right leg as well . Quality: Itchy, not painful, not draining . Duration: Few days . Modifying factors: Has tried Benadryl cream which hasn't really helped . Assoc signs/symptoms: The review of systems   Past medical, social and family history reviewed: Past Medical History  Diagnosis Date  . Hepatitis A 1998    with elevated LFT's  . Hyperlipidemia   . Hypertension   . IFG (impaired fasting glucose)   . AAA (abdominal aortic aneurysm) [facility]    Past Surgical History  Procedure Laterality Date  . Arthroscopic surgery  1982    left knee   Social History  Substance Use Topics  . Smoking status: Never Smoker   . Smokeless tobacco: Never Used  . Alcohol Use: 0.0 oz/week    0 Standard drinks or equivalent per week   Family History  Problem Relation Age of Onset  . Hypertension Mother   . Hyperlipidemia Mother   . Heart disease Father   . Hyperlipidemia Father   . Hypertension Father   . Hypertension Sister   . Hyperlipidemia Sister   . Hyperlipidemia Brother   . Hypertension Brother   . Heart disease Brother     Current Outpatient Prescriptions  Medication Sig Dispense Refill  . aspirin 81 MG tablet Take 81 mg by mouth daily.    . fluticasone (FLONASE) 50 MCG/ACT nasal spray Place 2 sprays into both nostrils daily. 16 g 2  . lisinopril-hydrochlorothiazide (PRINZIDE,ZESTORETIC) 10-12.5 MG tablet Take 1 tablet by mouth daily. 90 tablet 3  . simvastatin (ZOCOR) 40 MG tablet TAKE 1 TABLET EVERY EVENING 90 tablet 3   No current facility-administered medications for this visit.   No Known Allergies    Review of Systems: CONSTITUTIONAL:  No  fever, no chills, No recent illness, No  unintentional weight changes CARDIAC: No  chest pain RESPIRATORY: No  cough, No  shortness of breath/wheeze MUSCULOSKELETAL: No  myalgia/arthralgia NEUROLOGIC: No  weakness, No  dizziness  Exam:  BP 130/81 mmHg  Pulse 72  Temp(Src) 97.6 F (36.4 C) (Oral)  Wt 176 lb (79.833 kg) Constitutional: VS see above. General Appearance: alert, well-developed, well-nourished, NAD Skin: Erythematous, maculopapular rash as documented below and photographed, linear pattern consistent with contact dermatitis/abrasion. Skin is otherwise warm, dry, intact. No rash/ulcer. No concerning nevi or subq nodules on limited exam.   Psychiatric: Normal judgment/insight. Normal mood and affect. Oriented x3.      (picture is upside down but can see rash on L lateral leg and R medial leg)   ASSESSMENT/PLAN: Most likely contact dermatitis, likely due to exposure to plant. He has not been in contact with any thorn he plans/rose bushes, no insect exposure he is aware of. Advised wear long pants while doing any yard work to minimize future exposure. If rash becomes worse or changes, let us know. See patient instructions.  Allergic urticaria - Plan: methylPREDNISolone sodium succinate (SOLU-MEDROL) 125 mg/2 mL injection 125 mg, DISCONTINUED: methylPREDNISolone sodium succinate (SOLU-MEDROL) 80 mg in sodium chloride 0.9 % 50 mL IVPB  Contact dermatitis - Plan: methylPREDNISolone sodium succinate (SOLU-MEDROL) 125 mg/2 mL injection 125 mg, DISCONTINUED: methylPREDNISolone sodium succinate (SOLU-MEDROL) 80 mg in sodium chloride 0.9 %  50 mL IVPB     All questions were answered. Visit summary with medication list and pertinent instructions was printed for patient to review. ER/RTC precautions were reviewed with the patient. Return if symptoms worsen or fail to improve, and as directed by PCP for routine care.

## 2015-09-29 ENCOUNTER — Other Ambulatory Visit: Payer: Self-pay | Admitting: Osteopathic Medicine

## 2015-10-04 ENCOUNTER — Ambulatory Visit (INDEPENDENT_AMBULATORY_CARE_PROVIDER_SITE_OTHER): Payer: Commercial Managed Care - HMO | Admitting: Osteopathic Medicine

## 2015-10-04 ENCOUNTER — Encounter: Payer: Self-pay | Admitting: Osteopathic Medicine

## 2015-10-04 VITALS — BP 116/77 | HR 68 | Ht 66.0 in | Wt 172.0 lb

## 2015-10-04 DIAGNOSIS — L500 Allergic urticaria: Secondary | ICD-10-CM

## 2015-10-04 DIAGNOSIS — L259 Unspecified contact dermatitis, unspecified cause: Secondary | ICD-10-CM | POA: Diagnosis not present

## 2015-10-04 MED ORDER — MOMETASONE FUROATE 0.1 % EX CREA: 1.0000 "application " | TOPICAL_CREAM | Freq: Every day | CUTANEOUS | Status: DC | PRN

## 2015-10-04 NOTE — Progress Notes (Signed)
HPI: Brad Cannon is a 70 y.o. male who presents to [place] today for chief complaint of:  Chief Complaint  Patient presents with  . Rash    has not gotten any better     . Context: Possible exposure while up in the yard few days prior to presentation to clinic 1 week ago. New rash today after cutting grass again last week wearing shorts instead of pants . Location: Initial rash on left leg and medial right leg as well are a bit better, back of L leg is new and quite itchy . Quality: Itchy, not painful, not draining . Duration: >1 week, new rash 3 - 4 days ago on Left back of leg  . Modifying factors: Has tried Benadryl cream which hasn't really helped. At last visit was given IM SoluMedrol in office which helped, he thought his wife had some topical steroids at home but she didn't so he's been using OTC hydrocortisone.  . Assoc signs/symptoms: no fever, no joint pain   Past medical, social and family history reviewed: Past Medical History  Diagnosis Date  . Hepatitis A 1998    with elevated LFT's  . Hyperlipidemia   . Hypertension   . IFG (impaired fasting glucose)   . AAA (abdominal aortic aneurysm) [facility]    Past Surgical History  Procedure Laterality Date  . Arthroscopic surgery  1982    left knee   Social History  Substance Use Topics  . Smoking status: Never Smoker   . Smokeless tobacco: Never Used  . Alcohol Use: 0.0 oz/week    0 Standard drinks or equivalent per week   Family History  Problem Relation Age of Onset  . Hypertension Mother   . Hyperlipidemia Mother   . Heart disease Father   . Hyperlipidemia Father   . Hypertension Father   . Hypertension Sister   . Hyperlipidemia Sister   . Hyperlipidemia Brother   . Hypertension Brother   . Heart disease Brother     Current Outpatient Prescriptions  Medication Sig Dispense Refill  . aspirin 81 MG tablet Take 81 mg by mouth daily.    . fluticasone (FLONASE) 50  MCG/ACT nasal spray Place 2 sprays into both nostrils daily. 16 g 2  . hydrOXYzine (ATARAX/VISTARIL) 25 MG tablet Take 1-2 tablets (25-50 mg total) by mouth every 8 (eight) hours as needed for itching. 20 tablet 0  . lisinopril-hydrochlorothiazide (PRINZIDE,ZESTORETIC) 10-12.5 MG tablet Take 1 tablet by mouth daily. 90 tablet 3  . simvastatin (ZOCOR) 40 MG tablet TAKE 1 TABLET EVERY EVENING 90 tablet 3   No current facility-administered medications for this visit.   No Known Allergies    Review of Systems: CONSTITUTIONAL:  No  fever SKIN: rash as per HPI MUSCULOSKELETAL: No  myalgia/arthralgia   Exam:  BP 116/77 mmHg  Pulse 68  Ht 5\' 6"  (1.676 m)  Wt 172 lb (78.019 kg)  BMI 27.77 kg/m2 Constitutional: VS see above. General Appearance: alert, well-developed, well-nourished, NAD Skin: Erythematous, maculopapular rash as documented below and photographed, linear pattern consistent with contact dermatitis/abrasion - apperas less erythematous fro previous exam. Posterior L leg (+) maculopapular erythematous urticaria as per photo below, patchy. Skin is otherwise warm, dry, intact. No other rash/ulcer. No concerning nevi or subq nodules on limited exam.   Psychiatric: Normal judgment/insight. Normal mood and affect. Oriented x3.   Photo 09/27/15 (1 week ago)    10/04/2015  Newer rash   Rash that was present  last week        ASSESSMENT/PLAN: Most likely contact dermatitis, likely due to exposure to plant. He has not been in contact with any thorn he plans/rose bushes, no insect exposure he is aware of, not pets,. Advised wear long pants while doing any yard work to minimize future exposure. If rash becomes worse or changes, let us know, will consider dermatology referral. Decline injection today, will trial higher potency topical steroids for short term, advised patient these may thin/whiten the skin over time so use sparingly only on affected areas. Hydroxyzine prn.   Allergic  urticaria  Contact dermatitis     All questions were answered. Visit summary with medication list and pertinent instructions was printed for patient to review. ER/RTC precautions were reviewed with the patient. No Follow-up on file.

## 2015-10-06 ENCOUNTER — Other Ambulatory Visit: Payer: Self-pay | Admitting: Osteopathic Medicine

## 2015-10-25 IMAGING — US US SOFT TISSUE HEAD/NECK
1 series · 13 of 25 positions shown · non-contrast
Comparison: None.

CLINICAL DATA: Sensation of lump in throat.

EXAM:
THYROID ULTRASOUND
TECHNIQUE: Ultrasound examination of the thyroid gland and adjacent soft
tissues was performed.

[Series 1: us soft tissue head/neck · 0.08mm/px · 13 of 37 slices shown]
[im 1/37]
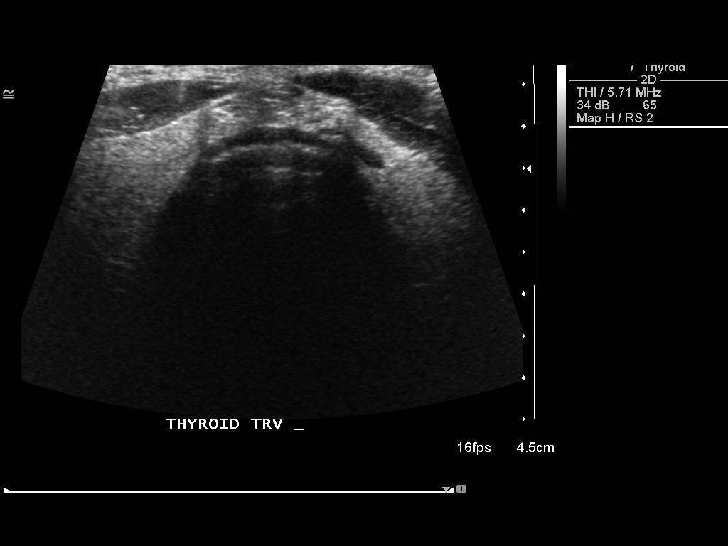
[im 4/37]
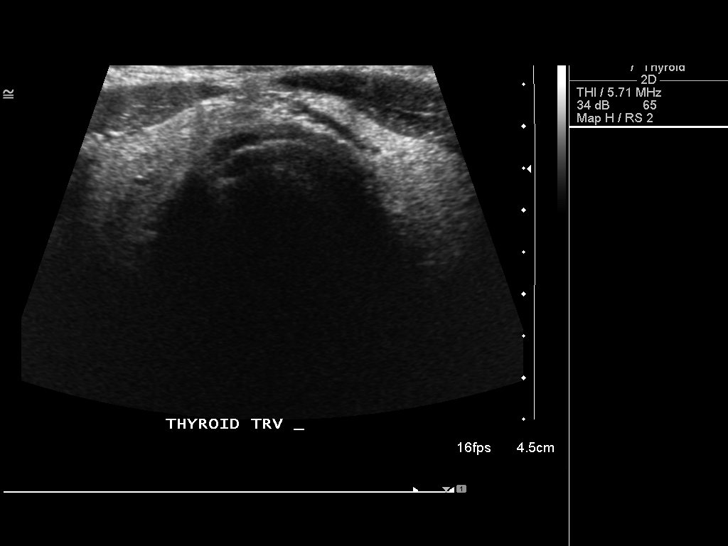
[im 7/37]
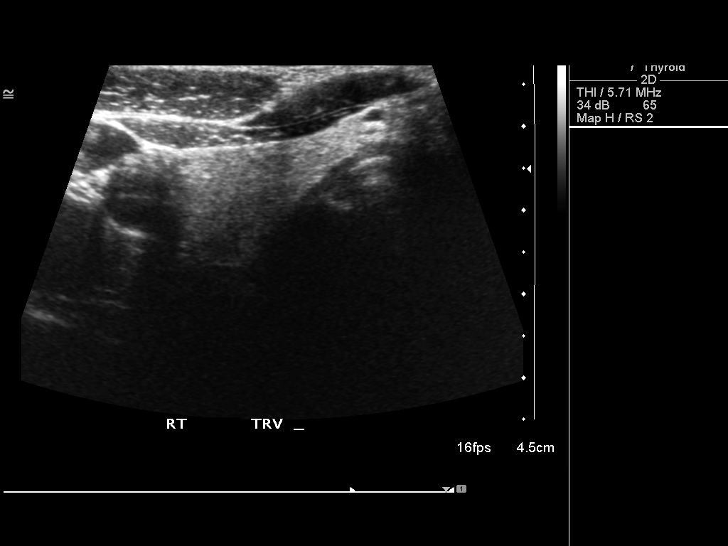
[im 10/37]
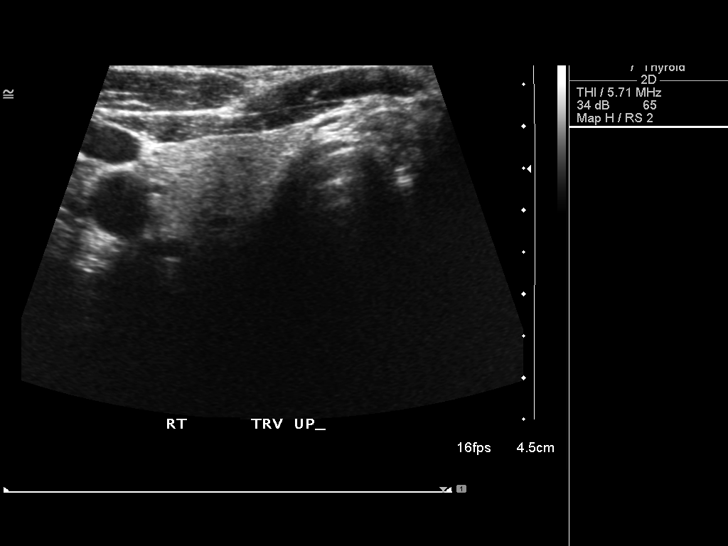
[im 13/37]
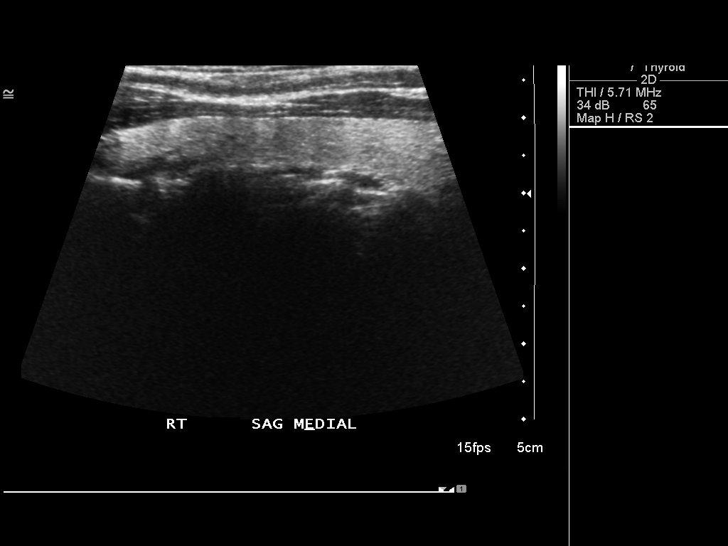
[im 16/37]
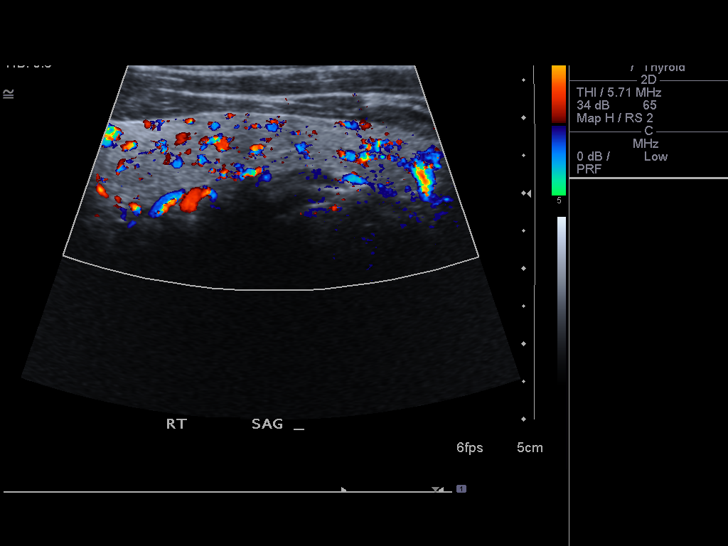
[im 19/37]
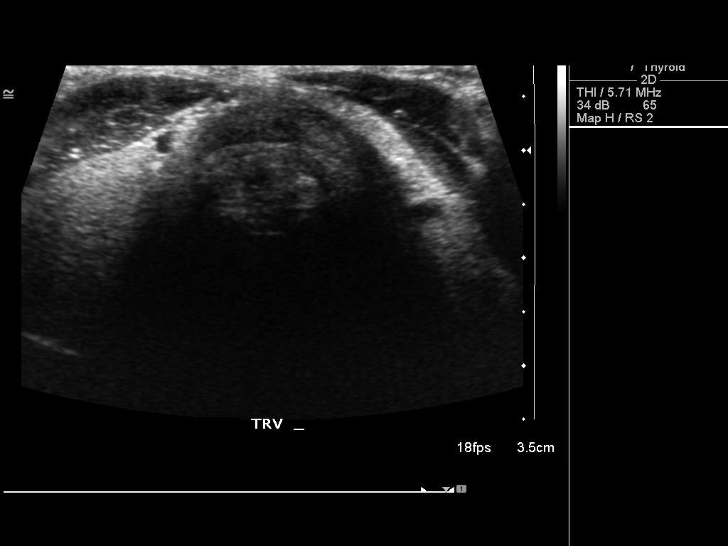
[im 22/37]
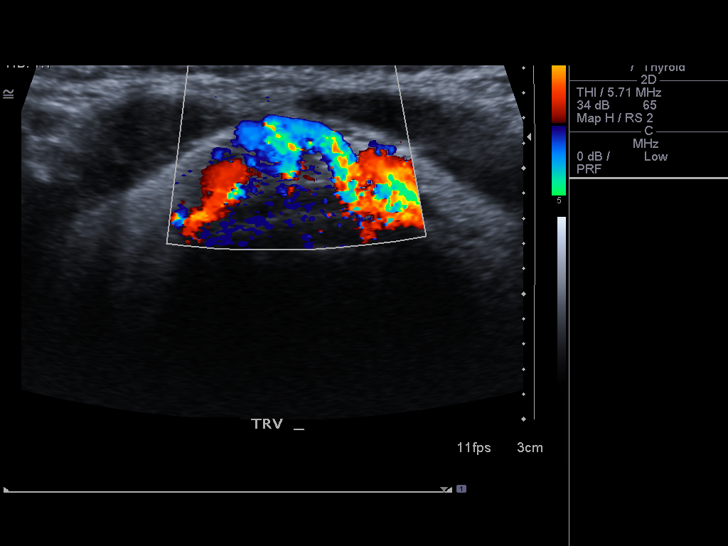
[im 25/37]
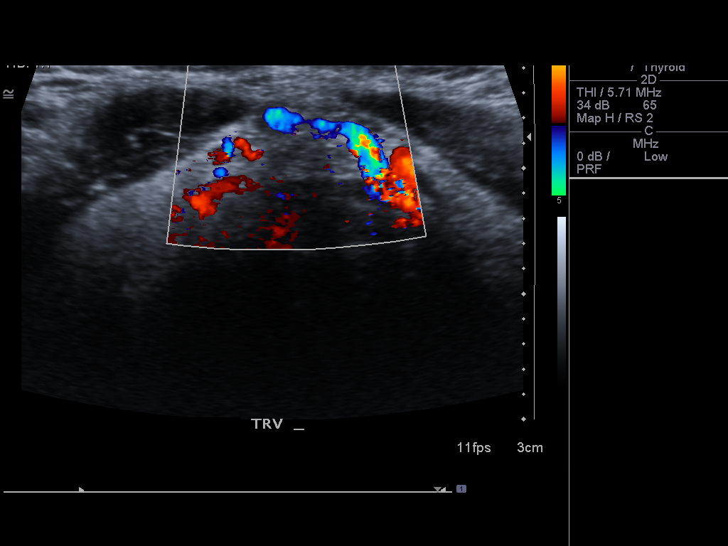
[im 28/37]
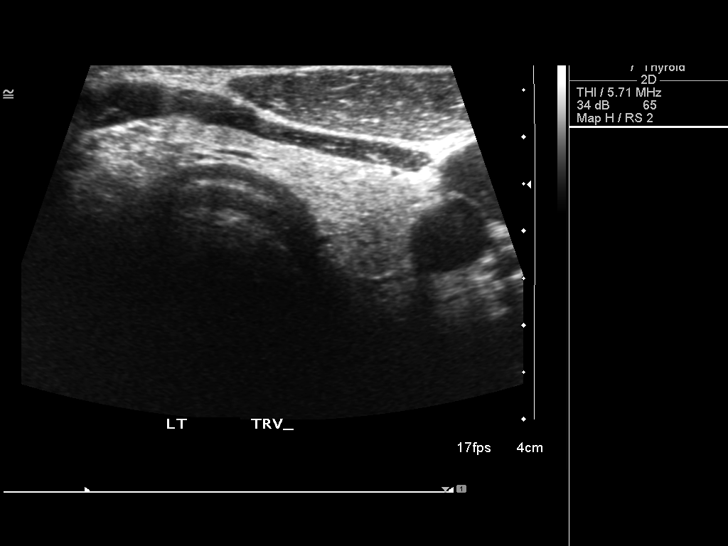
[im 31/37]
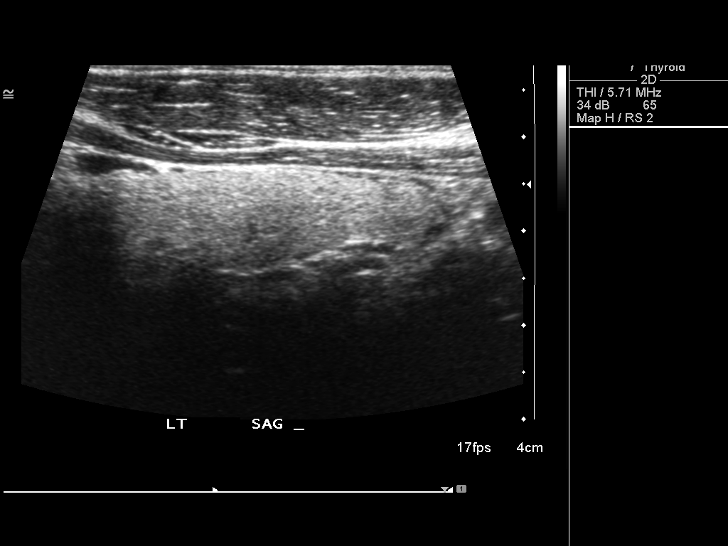
[im 34/37]
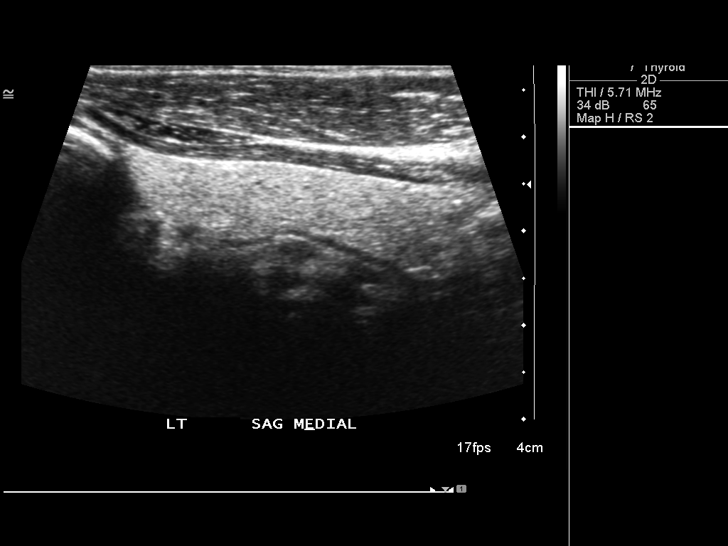
[im 37/37]
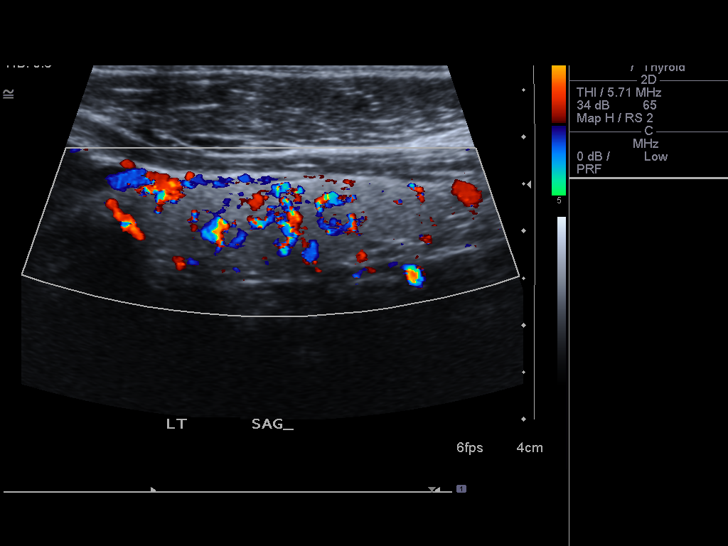

[13 of 25 positions shown; findings below may reference images not displayed]

FINDINGS: Right thyroid lobe

Measurements: 4.6 x 1.6 x 1.9 cm.  No nodules visualized.

Left thyroid lobe

Measurements: 4.3 x 1.3 x 1.5 cm.  No nodules visualized.

Isthmus

Thickness: 0.6 cm in the midline. There is a focal solid nodule in
the midline isthmus containing small areas of internal
microcalcification. The nodule was measured at 1.4 x 0.6 cm
transversely. Actual length may be as long as 1.6 cm. This nodule
does meet criteria for biopsy based on size and presence of
microcalcifications.

Lymphadenopathy

None visualized.
IMPRESSION: Solid midline isthmus nodule measuring up to 1.6 cm in greatest
length and demonstrating internal non shadowing microcalcifications.
The lesion meets size and morphology criteria for biopsy.

Findings meet consensus criteria for biopsy. Ultrasound-guided fine
needle aspiration should be considered, as per the consensus
statement: Management of Thyroid Nodules Detected at US: Society of
Radiologists in Ultrasound Consensus Conference Statement. Radiology

## 2015-11-14 IMAGING — US US SOFT TISSUE HEAD/NECK
1 series · 10 of 10 positions shown · non-contrast
Comparison: 10/22/2013

CLINICAL DATA: The patient was referred for a isthmic thyroid
biopsy.

EXAM:
THYROID ULTRASOUND
TECHNIQUE: Ultrasound examination of the thyroid gland and adjacent soft
tissues was performed.

[Series 1: us soft tissue head/neck · 0.05mm/px · 10 acquisitions, 10 frames shown]
[im 1/10]
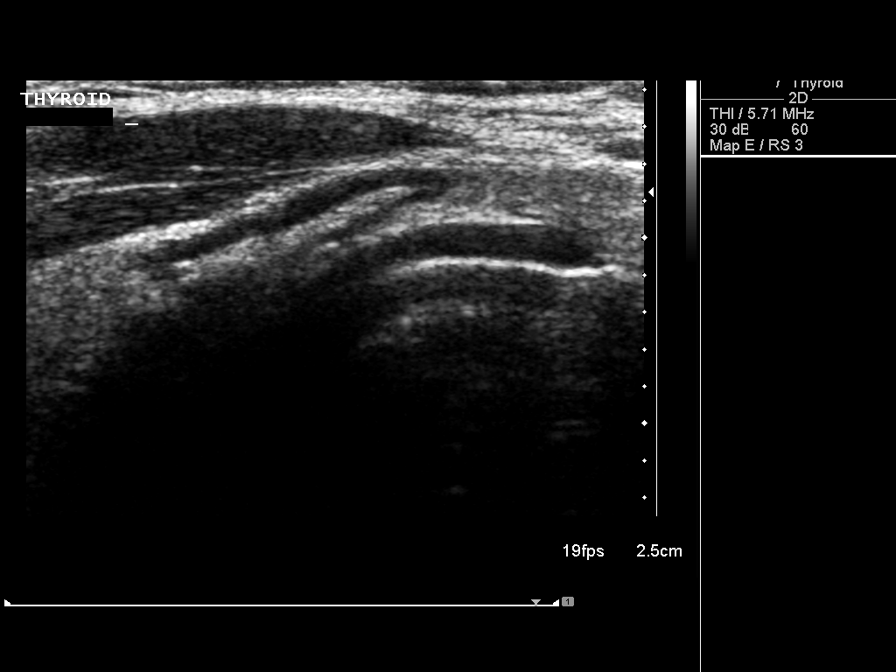
[im 2/10]
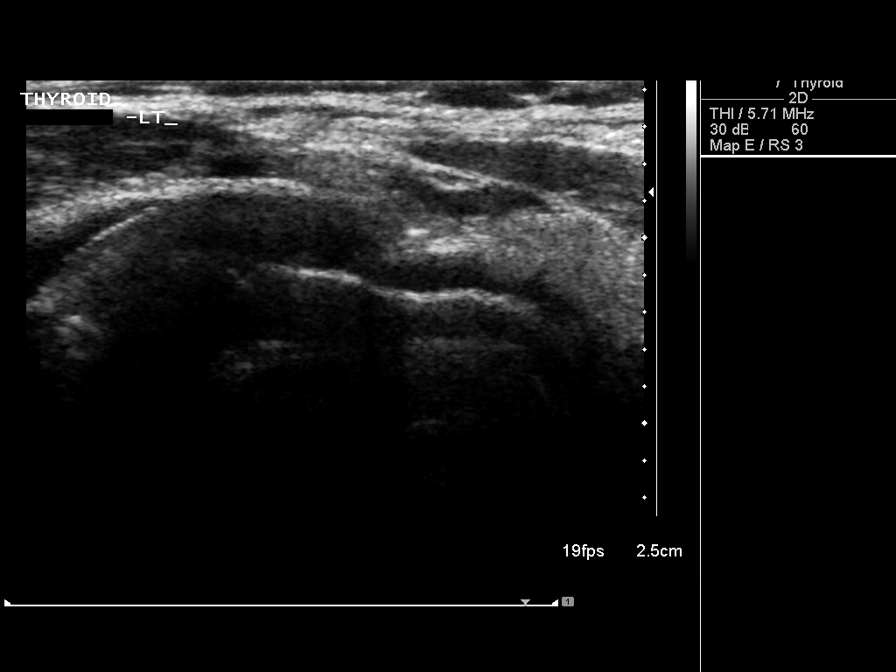
[im 3/10]
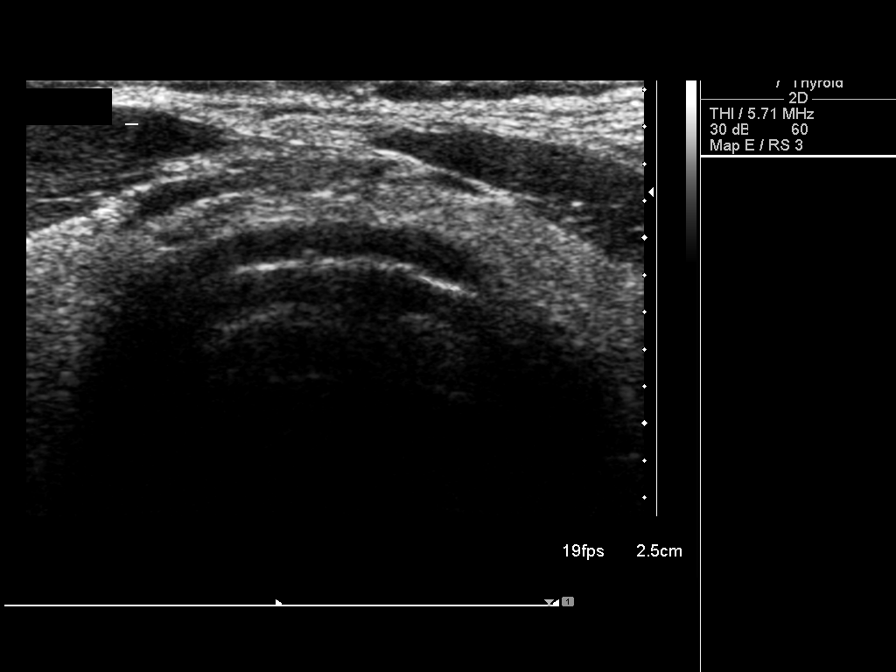
[im 4/10]
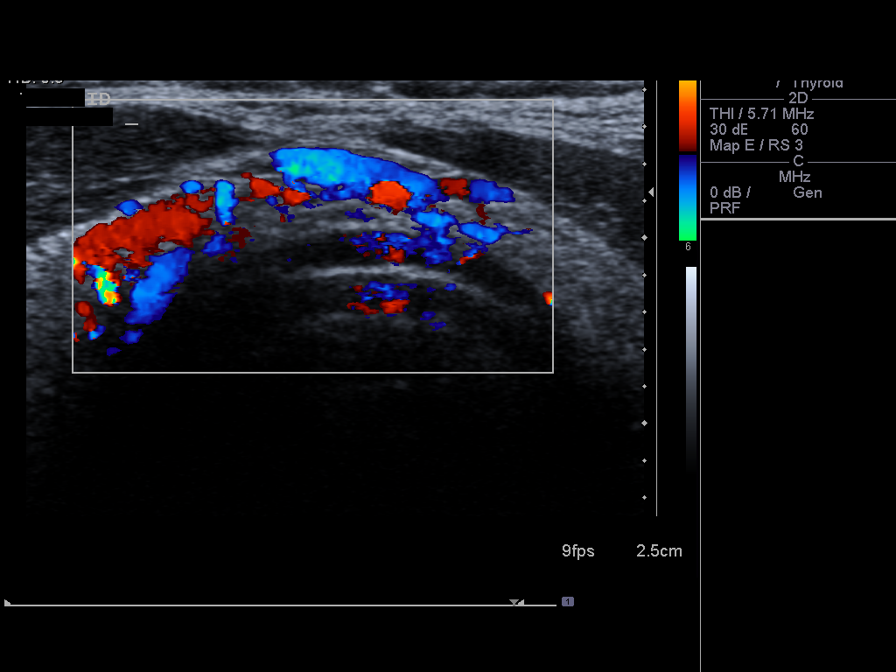
[im 5/10]
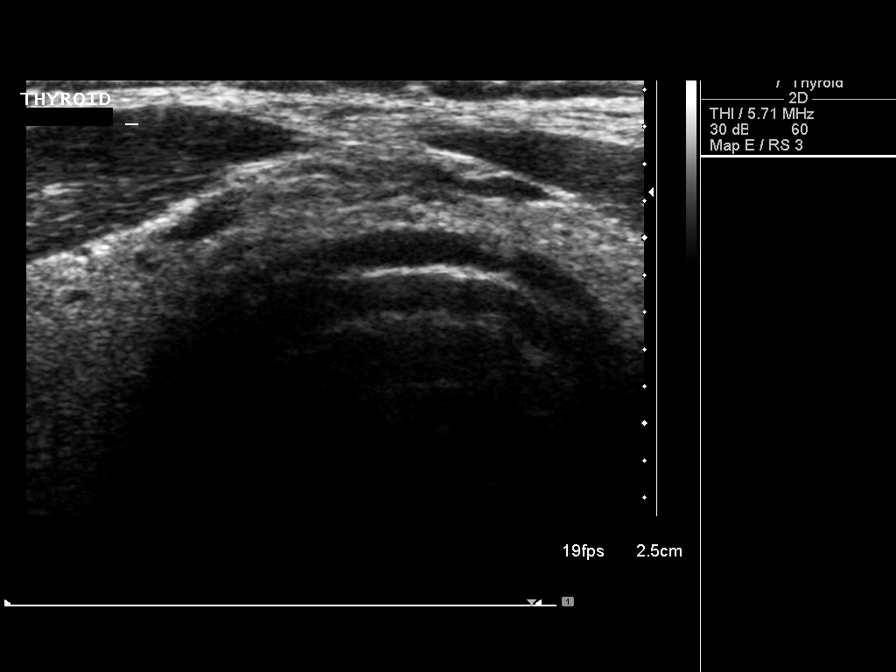
[im 6/10]
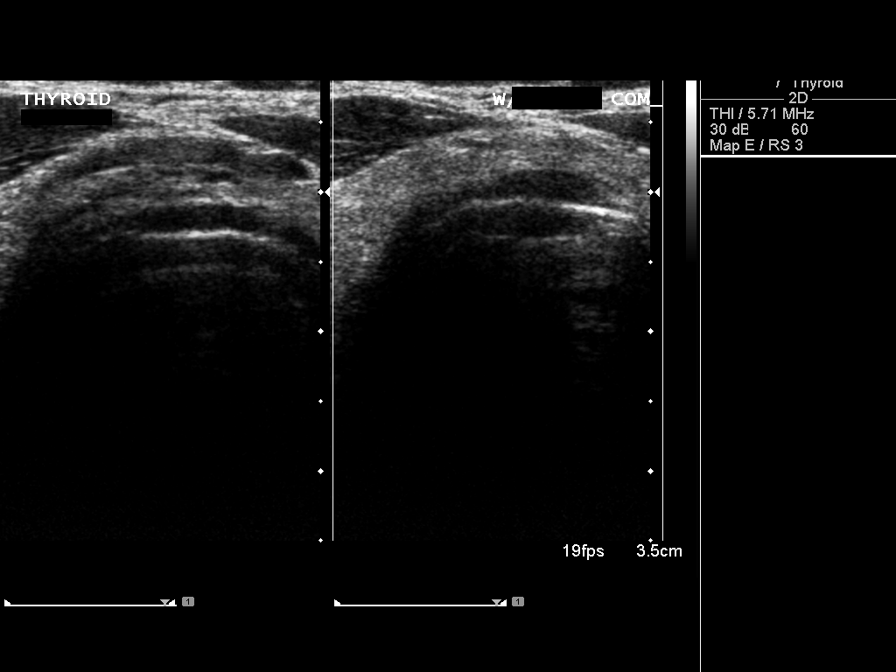
[im 7/10]
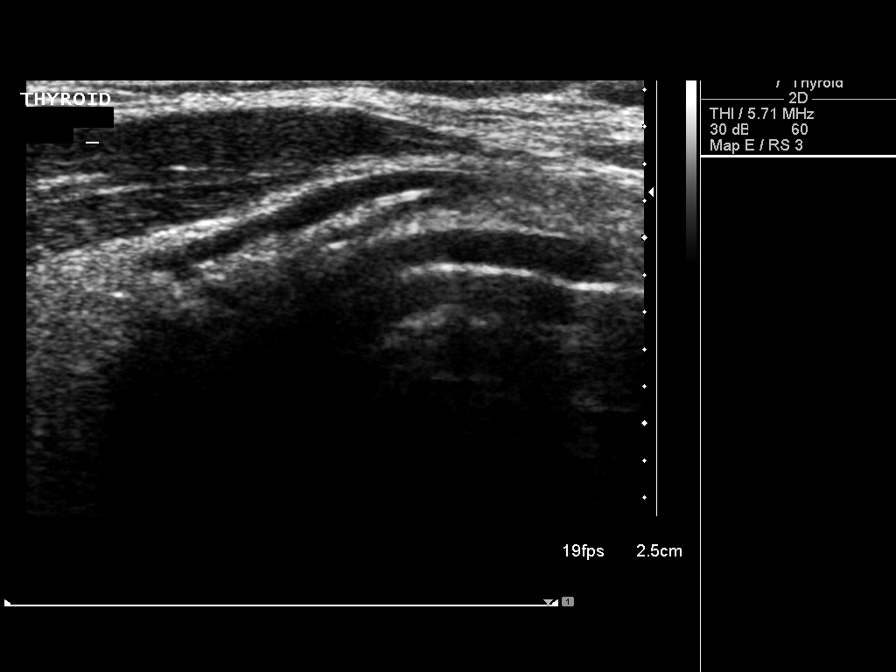
[im 8/10]
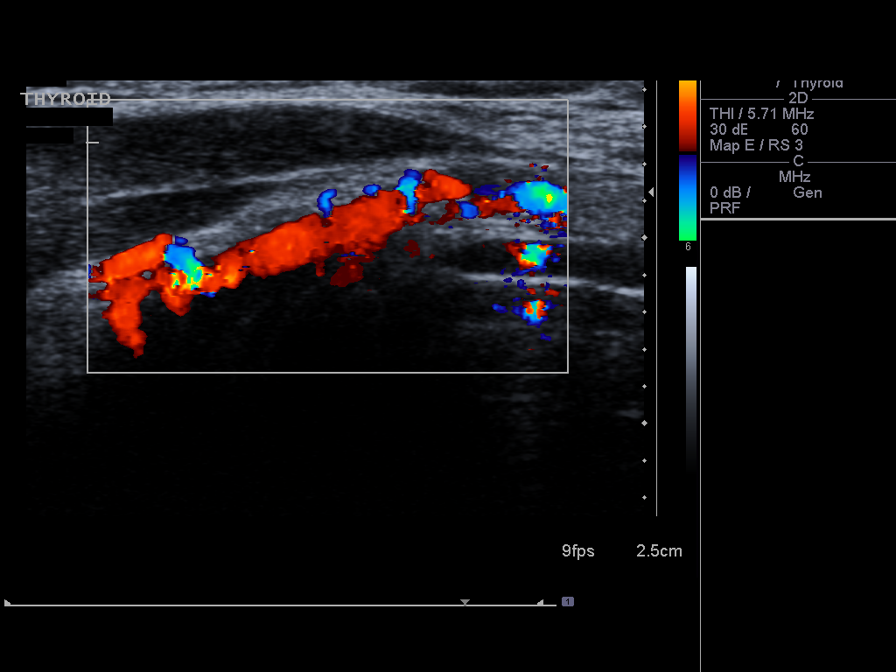
[im 9/10]
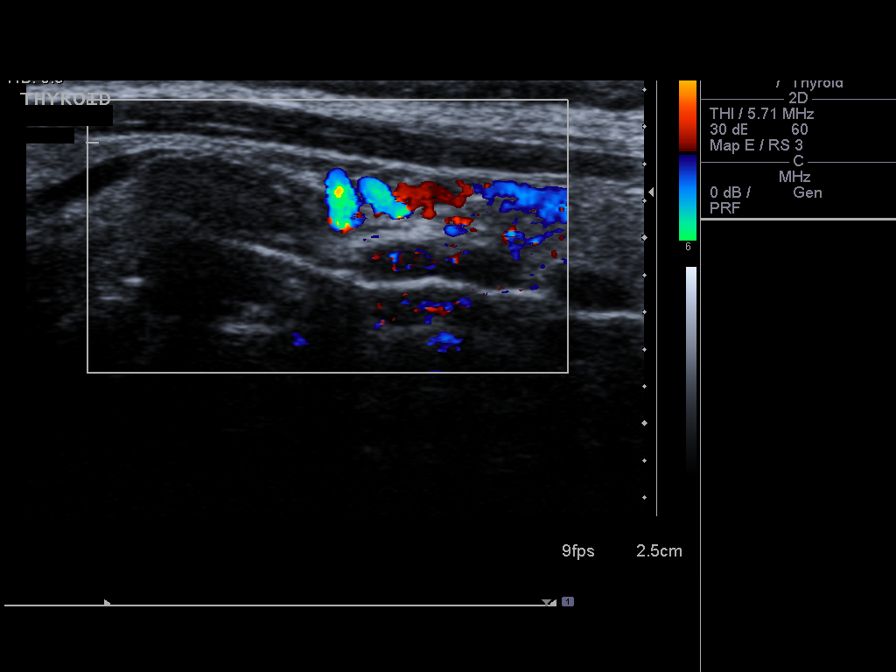
[im 10/10]
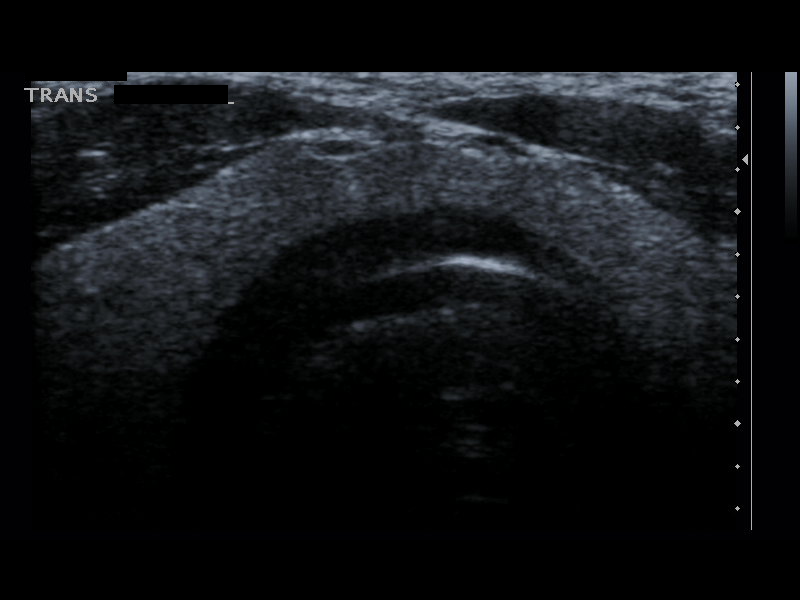

[10 of 10 positions shown; findings below may reference images not displayed]

FINDINGS: The abnormality in the isthmus is related to prominent vessels and
mildly heterogeneous tissue. There is no defined nodule.
IMPRESSION: The abnormality in question is a pseudo nodule. Biopsy was not
performed.

## 2016-03-02 ENCOUNTER — Ambulatory Visit (INDEPENDENT_AMBULATORY_CARE_PROVIDER_SITE_OTHER): Payer: Commercial Managed Care - HMO | Admitting: Family Medicine

## 2016-03-02 ENCOUNTER — Encounter: Payer: Self-pay | Admitting: Family Medicine

## 2016-03-02 VITALS — BP 119/63 | HR 65 | Ht 66.0 in | Wt 171.0 lb

## 2016-03-02 DIAGNOSIS — I10 Essential (primary) hypertension: Secondary | ICD-10-CM

## 2016-03-02 DIAGNOSIS — H6121 Impacted cerumen, right ear: Secondary | ICD-10-CM | POA: Diagnosis not present

## 2016-03-02 DIAGNOSIS — R740 Nonspecific elevation of levels of transaminase and lactic acid dehydrogenase [LDH]: Secondary | ICD-10-CM

## 2016-03-02 DIAGNOSIS — R7401 Elevation of levels of liver transaminase levels: Secondary | ICD-10-CM

## 2016-03-02 DIAGNOSIS — R7301 Impaired fasting glucose: Secondary | ICD-10-CM | POA: Diagnosis not present

## 2016-03-02 LAB — LIPID PANEL
Cholesterol: 134 mg/dL (ref <200)
HDL: 48 mg/dL (ref >40)
LDL CALC: 74 mg/dL (ref <100)
Total CHOL/HDL Ratio: 2.8 Ratio (ref <5.0)
Triglycerides: 60 mg/dL (ref <150)
VLDL: 12 mg/dL (ref <30)

## 2016-03-02 LAB — COMPLETE METABOLIC PANEL WITH GFR
ALT: 19 U/L (ref 9–46)
AST: 65 U/L — ABNORMAL HIGH (ref 10–35)
Albumin: 4.3 g/dL (ref 3.6–5.1)
Alkaline Phosphatase: 42 U/L (ref 40–115)
BILIRUBIN TOTAL: 1.1 mg/dL (ref 0.2–1.2)
BUN: 16 mg/dL (ref 7–25)
CO2: 25 mmol/L (ref 20–31)
CREATININE: 0.92 mg/dL (ref 0.70–1.18)
Calcium: 9.2 mg/dL (ref 8.6–10.3)
Chloride: 105 mmol/L (ref 98–110)
GFR, Est Non African American: 83 mL/min (ref >=60)
GLUCOSE: 98 mg/dL (ref 65–99)
Potassium: 4.1 mmol/L (ref 3.5–5.3)
SODIUM: 138 mmol/L (ref 135–146)
Total Protein: 6.9 g/dL (ref 6.1–8.1)

## 2016-03-02 LAB — POCT GLYCOSYLATED HEMOGLOBIN (HGB A1C): HEMOGLOBIN A1C: 5.7

## 2016-03-02 MED ORDER — AMBULATORY NON FORMULARY MEDICATION: 0 refills | Status: DC

## 2016-03-02 NOTE — Progress Notes (Signed)
Subjective:    CC: DM  HPI: Hypertension- Pt denies chest pain, SOB, dizziness, or heart palpitations.  Taking meds as directed w/o problems.  Denies medication side effects.    Abnormal glucose-no increased thirst or urination. He normally does a lot of walking for exercise and goes to the gym but recently he and his wife bought a new house and so they have been packing and boxing things and so he hasn't been as physically active as far as doing his regular workout routine.  He feels like his right ear is blocked up. He has not been having any pain or drainage but would like to have it examined.  Past medical history, Surgical history, Family history not pertinant except as noted below, Social history, Allergies, and medications have been entered into the medical record, reviewed, and corrections made.   Review of Systems: No fevers, chills, night sweats, weight loss, chest pain, or shortness of breath.   Objective:    General: Well Developed, well nourished, and in no acute distress.  Neuro: Alert and oriented x3, extra-ocular muscles intact, sensation grossly intact.  HEENT: Normocephalic, atraumatic , Left TM and canal is clear. Right canal is blocked by cerumen. Skin: Warm and dry, no rashes. Cardiac: Regular rate and rhythm, no murmurs rubs or gallops, no lower extremity edema.  Respiratory: Clear to auscultation bilaterally. Not using accessory muscles, speaking in full sentences.   Impression and Recommendations:   Hypertension-well-controlled. Continue current regimen. He should have plenty of refills. Due for CMP and lipid panel.  Impaired fasting glucose-Hemoccult A1c 5.7 today. Discussed strategies to work on diet and get back into his regular exercise. Plan to recheck A1c in 6 months.  Cerumen impaction-ear irrigated. Patient tolerated well. Follow-up if any problems.  Given prescription to get his tetanus updated. Last one was 10 years ago.  Indication: Cerumen  impaction of the ea, right Medical necessity statement: On physical examination, cerumen impairs clinically significant portions of the external auditory canal, and tympanic membrane. Noted obstructive, copious cerumen that cannot be removed without magnification and instrumentation.  Consent: Discussed benefits and risks of procedure and verbal consent obtained Procedure: Patient was prepped for the procedure. Utilized an otoscope to assess and take note of the ear canal, the tympanic membrane, and the presence, amount, and placement of the cerumen. Gentle water irrigation and soft plastic curette was utilized to remove cerumen.  Post procedure examination: shows cerumen was completely removed. Patient tolerated procedure well. The patient is made aware that they may experience temporary vertigo, temporary hearing loss, and temporary discomfort. If these symptom last for more than 24 hours to call the clinic or proceed to the ED.

## 2016-03-02 NOTE — Patient Instructions (Addendum)
Preventing Type 2 Diabetes Mellitus Type 2 diabetes (type 2 diabetes mellitus) is a long-term (chronic) disease that affects blood sugar (glucose) levels. Normally, a hormone called insulin allows glucose to enter cells in the body. The cells use glucose for energy. In type 2 diabetes, one or both of these problems may be present:  The body does not make enough insulin.  The body does not respond properly to insulin that it makes (insulin resistance). Insulin resistance or lack of insulin causes excess glucose to build up in the blood instead of going into cells. As a result, high blood glucose (hyperglycemia) develops, which can cause many complications. Being overweight or obese and having an inactive (sedentary) lifestyle can increase your risk for diabetes. Type 2 diabetes can be delayed or prevented by making certain nutrition and lifestyle changes. What nutrition changes can be made?  Eat healthy meals and snacks regularly. Keep a healthy snack with you for when you get hungry between meals, such as fruit or a handful of nuts.  Eat lean meats and proteins that are low in saturated fats, such as chicken, fish, egg whites, and beans. Avoid processed meats.  Eat plenty of fruits and vegetables and plenty of grains that have not been processed (whole grains). It is recommended that you eat:  1?2 cups of fruit every day.  2?3 cups of vegetables every day.  6?8 oz of whole grains every day, such as oats, whole wheat, bulgur, brown rice, quinoa, and millet.  Eat low-fat dairy products, such as milk, yogurt, and cheese.  Eat foods that contain healthy fats, such as nuts, avocado, olive oil, and canola oil.  Drink water throughout the day. Avoid drinks that contain added sugar, such as soda or sweet tea.  Follow instructions from your health care provider about specific eating or drinking restrictions.  Control how much food you eat at a time (portion size).  Check food labels to find  out the serving sizes of foods.  Use a kitchen scale to weigh amounts of foods.  Saute or steam food instead of frying it. Cook with water or broth instead of oils or butter.  Limit your intake of:  Salt (sodium). Have no more than 1 tsp (2,400 mg) of sodium a day. If you have heart disease or high blood pressure, have less than ? tsp (1,500 mg) of sodium a day.  Saturated fat. This is fat that is solid at room temperature, such as butter or fat on meat. What lifestyle changes can be made?  Activity  Do moderate-intensity physical activity for at least 30 minutes on at least 5 days of the week, or as much as told by your health care provider.  Ask your health care provider what activities are safe for you. A mix of physical activities may be best, such as walking, swimming, cycling, and strength training.  Try to add physical activity into your day. For example:  Park in spots that are farther away than usual, so that you walk more. For example, park in a far corner of the parking lot when you go to the office or the grocery store.  Take a walk during your lunch break.  Use stairs instead of elevators or escalators. Weight Loss  Lose weight as directed. Your health care provider can determine how much weight loss is best for you and can help you lose weight safely.  If you are overweight or obese, you may be instructed to lose at least 5?7 % of  your body weight. Alcohol and Tobacco   Limit alcohol intake to no more than 1 drink a day for nonpregnant women and 2 drinks a day for men. One drink equals 12 oz of beer, 5 oz of wine, or 1 oz of hard liquor.  Do not use any tobacco products, such as cigarettes, chewing tobacco, and e-cigarettes. If you need help quitting, ask your health care provider. Work With [facility] Provider  Have your blood glucose tested regularly, as told by your health care provider.  Discuss your risk factors and how you can reduce your risk for  diabetes.  Get screening tests as told by your health care provider. You may have screening tests regularly, especially if you have certain risk factors for type 2 diabetes.  Make an appointment with a diet and nutrition specialist (registered dietitian). A registered dietitian can help you make a healthy eating plan and can help you understand portion sizes and food labels. Why are these changes important?  It is possible to prevent or delay type 2 diabetes and related health problems by making lifestyle and nutrition changes.  It can be difficult to recognize signs of type 2 diabetes. The best way to avoid possible damage to your body is to take actions to prevent the disease before you develop symptoms. What can happen if changes are not made?  Your blood glucose levels may keep increasing. Having high blood glucose for a long time is dangerous. Too much glucose in your blood can damage your blood vessels, heart, kidneys, nerves, and eyes.  You may develop prediabetes or type 2 diabetes. Type 2 diabetes can lead to many chronic health problems and complications, such as:  Heart disease.  Stroke.  Blindness.  Kidney disease.  Depression.  Poor circulation in the feet and legs, which could lead to surgical removal (amputation) in severe cases. Where to find support:  Ask your health care provider to recommend a registered dietitian, diabetes educator, or weight loss program.  Look for local or online weight loss groups.  Join a gym, fitness club, or outdoor activity group, such as a walking club. Where to find more information: To learn more about diabetes and diabetes prevention, visit:  American Diabetes Association (ADA): www.diabetes.[employer] of Diabetes and Digestive and Kidney Diseases: FindSpin.nl To learn more about healthy eating, visit:  The U.S. Department of Agriculture Scientist, research (physical sciences)), Choose My Plate:  http://wiley-williams.com/  Office of Disease Prevention and Health Promotion (ODPHP), Dietary Guidelines: SurferLive.at Summary  You can reduce your risk for type 2 diabetes by increasing your physical activity, eating healthy foods, and losing weight as directed.  Talk with your health care provider about your risk for type 2 diabetes. Ask about any blood tests or screening tests that you need to have. This information is not intended to replace advice given to you by your health care provider. Make sure you discuss any questions you have with your health care provider. Document Released: 07/12/2015 Document Revised: 08/26/2015 Document Reviewed: 05/11/2015 Elsevier Interactive Patient Education  2017 [employer].

## 2016-03-03 NOTE — Addendum Note (Signed)
Addended by: Teddy Spike on: 03/03/2016 08:39 AM   Modules accepted: Orders

## 2016-03-13 ENCOUNTER — Other Ambulatory Visit: Payer: Self-pay | Admitting: *Deleted

## 2016-03-13 DIAGNOSIS — I10 Essential (primary) hypertension: Secondary | ICD-10-CM

## 2016-03-13 MED ORDER — LISINOPRIL-HYDROCHLOROTHIAZIDE 10-12.5 MG PO TABS: 1.0000 | ORAL_TABLET | Freq: Every day | ORAL | 3 refills | Status: DC

## 2016-03-13 MED ORDER — SIMVASTATIN 40 MG PO TABS: ORAL_TABLET | ORAL | 3 refills | Status: DC

## 2016-04-12 DIAGNOSIS — R740 Nonspecific elevation of levels of transaminase and lactic acid dehydrogenase [LDH]: Secondary | ICD-10-CM | POA: Diagnosis not present

## 2016-04-12 LAB — HEPATIC FUNCTION PANEL
ALK PHOS: 51 U/L (ref 40–115)
ALT: 16 U/L (ref 9–46)
AST: 51 U/L — ABNORMAL HIGH (ref 10–35)
Albumin: 4.3 g/dL (ref 3.6–5.1)
BILIRUBIN INDIRECT: 0.7 mg/dL (ref 0.2–1.2)
Bilirubin, Direct: 0.2 mg/dL (ref <=0.2)
Total Bilirubin: 0.9 mg/dL (ref 0.2–1.2)
Total Protein: 7.3 g/dL (ref 6.1–8.1)

## 2016-04-13 ENCOUNTER — Other Ambulatory Visit: Payer: Self-pay | Admitting: *Deleted

## 2016-04-13 DIAGNOSIS — R945 Abnormal results of liver function studies: Principal | ICD-10-CM

## 2016-04-13 DIAGNOSIS — R7989 Other specified abnormal findings of blood chemistry: Secondary | ICD-10-CM

## 2016-04-19 ENCOUNTER — Other Ambulatory Visit: Payer: Commercial Managed Care - HMO

## 2016-04-25 ENCOUNTER — Ambulatory Visit (INDEPENDENT_AMBULATORY_CARE_PROVIDER_SITE_OTHER): Payer: Commercial Managed Care - HMO

## 2016-04-25 DIAGNOSIS — R945 Abnormal results of liver function studies: Secondary | ICD-10-CM

## 2016-04-25 DIAGNOSIS — I10 Essential (primary) hypertension: Secondary | ICD-10-CM

## 2016-04-25 DIAGNOSIS — E785 Hyperlipidemia, unspecified: Secondary | ICD-10-CM | POA: Diagnosis not present

## 2016-04-25 DIAGNOSIS — R7989 Other specified abnormal findings of blood chemistry: Secondary | ICD-10-CM

## 2016-06-15 DIAGNOSIS — H524 Presbyopia: Secondary | ICD-10-CM | POA: Diagnosis not present

## 2016-06-15 DIAGNOSIS — H2513 Age-related nuclear cataract, bilateral: Secondary | ICD-10-CM | POA: Diagnosis not present

## 2016-06-15 DIAGNOSIS — H43393 Other vitreous opacities, bilateral: Secondary | ICD-10-CM | POA: Diagnosis not present

## 2016-06-15 DIAGNOSIS — Z0100 Encounter for examination of eyes and vision without abnormal findings: Secondary | ICD-10-CM | POA: Diagnosis not present

## 2016-06-15 DIAGNOSIS — H52223 Regular astigmatism, bilateral: Secondary | ICD-10-CM | POA: Diagnosis not present

## 2016-06-15 DIAGNOSIS — H5213 Myopia, bilateral: Secondary | ICD-10-CM | POA: Diagnosis not present

## 2016-06-15 DIAGNOSIS — H43813 Vitreous degeneration, bilateral: Secondary | ICD-10-CM | POA: Diagnosis not present

## 2016-08-21 ENCOUNTER — Ambulatory Visit: Payer: Commercial Managed Care - HMO | Admitting: Family Medicine

## 2016-08-22 ENCOUNTER — Ambulatory Visit (INDEPENDENT_AMBULATORY_CARE_PROVIDER_SITE_OTHER): Payer: Medicare HMO | Admitting: Family Medicine

## 2016-08-22 ENCOUNTER — Encounter: Payer: Self-pay | Admitting: Family Medicine

## 2016-08-22 VITALS — BP 126/71 | HR 77 | Ht 66.0 in | Wt 169.0 lb

## 2016-08-22 DIAGNOSIS — I10 Essential (primary) hypertension: Secondary | ICD-10-CM

## 2016-08-22 DIAGNOSIS — R972 Elevated prostate specific antigen [PSA]: Secondary | ICD-10-CM

## 2016-08-22 DIAGNOSIS — R7301 Impaired fasting glucose: Secondary | ICD-10-CM | POA: Diagnosis not present

## 2016-08-22 DIAGNOSIS — K2100 Gastro-esophageal reflux disease with esophagitis, without bleeding: Secondary | ICD-10-CM

## 2016-08-22 LAB — BASIC METABOLIC PANEL WITH GFR
BUN: 21 mg/dL (ref 7–25)
CALCIUM: 9.9 mg/dL (ref 8.6–10.3)
CO2: 24 mmol/L (ref 20–31)
CREATININE: 0.94 mg/dL (ref 0.70–1.18)
Chloride: 103 mmol/L (ref 98–110)
GFR, Est African American: >89 mL/min (ref >=60)
GFR, Est Non African American: 81 mL/min (ref >=60)
GLUCOSE: 100 mg/dL — AB (ref 65–99)
Potassium: 4.4 mmol/L (ref 3.5–5.3)
Sodium: 138 mmol/L (ref 135–146)

## 2016-08-22 LAB — POCT GLYCOSYLATED HEMOGLOBIN (HGB A1C): Hemoglobin A1C: 5.5

## 2016-08-22 MED ORDER — NAPROXEN 500 MG PO TABS: 500.0000 mg | ORAL_TABLET | Freq: Two times a day (BID) | ORAL | 3 refills | Status: DC

## 2016-08-22 MED ORDER — OMEPRAZOLE 40 MG PO CPDR: 40.0000 mg | DELAYED_RELEASE_CAPSULE | Freq: Every day | ORAL | 3 refills | Status: DC

## 2016-08-22 NOTE — Progress Notes (Signed)
Subjective:    Patient ID: Brad Cannon, male    DOB: September 16, 1945, 71 y.o.   MRN: [id number]  HPI   C/o of left shoulder pain  X 1 month on and off.  Points to the top of his shoulder between His neck and the shoulder. He says that it's worse with certain positions. He says for example when he is walking for exercise and just has his arm swinging has started to notice the pain. Also when he goes swimming and he starts out by floating on his back to notice it. But once he flips over and starts doing just a regular freestyle swim that actually seems to go away. Sometimes the pain will actually shoot down the outer arm to just above the wrist. That pain he describes more as a tingling. He actually says it's been a little bit better recently. He denies any significant back or neck pain.  He says it very much reminds him of the pain he had in his right arm years ago but wasn't really affecting his shoulder. At that time was diagnosis thoracic outlet syndrome. He says his responded really well to Naprosyn in the past.  He also complains of recent increase in heartburn and indigestion. He said on Mother's Day he went up [employer] and had a salad and a couple cups of decaf coffee and within a couple hours started getting severe epigastric pain that radiated up into his chest. He said it lasted about 3 hours and then resolved. He's been more belchy. He is not currently taking anything for heartburn.   Impaired fasting glucose-no increased thirst or urination. No symptoms consistent with hypoglycemia.   Review of Systems  BP 126/71   Pulse 77   Ht 5\' 6"  (1.676 m)   Wt 169 lb (76.7 kg)   SpO2 99%   BMI 27.28 kg/m     No Known Allergies  Past Medical History:  Diagnosis Date  . AAA (abdominal aortic aneurysm) ([place])   . Hepatitis A 1998   with elevated LFT's  . Hyperlipidemia   . Hypertension   . IFG (impaired fasting glucose)     Past Surgical History:  Procedure Laterality Date  .  arthroscopic surgery  1982   left knee    Social History   Social History  . Marital status: Married    Spouse name: N/A  . Number of children: N/A  . Years of education: N/A   Occupational History  . Not on file.   Social History Main Topics  . Smoking status: Never Smoker  . Smokeless tobacco: Never Used  . Alcohol use 0.0 oz/week  . Drug use: No  . Sexual activity: Not on file   Other Topics Concern  . Not on file   Social History Narrative   Works out regularly.  No caffeine.      Family History  Problem Relation Age of Onset  . Hypertension Mother   . Hyperlipidemia Mother   . Heart disease Father   . Hyperlipidemia Father   . Hypertension Father   . Hypertension Sister   . Hyperlipidemia Sister   . Hyperlipidemia Brother   . Hypertension Brother   . Heart disease Brother     Outpatient Encounter Prescriptions as of 08/22/2016  Medication Sig  . aspirin 81 MG tablet Take 81 mg by mouth daily.  . fluticasone (FLONASE) 50 MCG/ACT nasal spray Place 2 sprays into both nostrils daily.  Marland Kitchen lisinopril-hydrochlorothiazide (PRINZIDE,ZESTORETIC) 10-12.5 MG tablet  Take 1 tablet by mouth daily.  . simvastatin (ZOCOR) 40 MG tablet TAKE 1 TABLET EVERY EVENING  . naproxen (NAPROSYN) 500 MG tablet Take 1 tablet (500 mg total) by mouth 2 (two) times daily with a meal.  . omeprazole (PRILOSEC) 40 MG capsule Take 1 capsule (40 mg total) by mouth daily.  . [DISCONTINUED] AMBULATORY NON FORMULARY MEDICATION Medication Name: Tdap IM x 1   No facility-administered encounter medications on file as of 08/22/2016.          Objective:   Physical Exam  Constitutional: He is oriented to person, place, and time. He appears well-developed and well-nourished.  HENT:  Head: Normocephalic and atraumatic.  Cardiovascular: Normal rate, regular rhythm and normal heart sounds.   Pulmonary/Chest: Effort normal and breath sounds normal.  Musculoskeletal:  Neck with normal flexion,  decreased extension. Decreased rotation right and left but symmetric. And decreased side bending right and left but symmetric. Nontender over the cervical spine. Tender between the clavicle and scapula at the top of the left shoulder close to the neck. Shoulder is, bilaterally, with normal range of motion. Negative empty can test bilaterally. Strength of the shoulder elbow and wrist is 5 out of 5 bilaterally. External external rotation of that left shoulder. Unable to re-create discomfort or tingling.  Neurological: He is alert and oriented to person, place, and time.  Skin: Skin is warm and dry.  Psychiatric: He has a normal mood and affect. His behavior is normal.        Assessment & Plan:  Left shoulder pain -  Really suspect a radiculopathy coming from his cervical spine. Will start with getting plain film x-ray if he is not improving after using Naprosyn for a couple of weeks. Work on gentle stretching. He feels like the pain is actually better than it was a couple weeks ago so it may just be easing off on its own. 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  GERD - Recommend a trial of omeprazole 40 mg daily. If this is working well been continue to treat for 6 weeks and then wean off the medication. Also reviewed some dietary measures with him to reduce GERD and reflux. Suspect they probably has a mild case of gastritis. If not improving after 5-7 days and please let me know.  IFG - STable. Well controlled. REcheck in 6-12 months.   Lab Results  Component Value Date   HGBA1C 5.5 08/22/2016     Colonoscopy due 03/2007

## 2016-08-23 LAB — PSA: PSA: 1.3 ng/mL (ref <=4.0)

## 2016-08-23 NOTE — Progress Notes (Signed)
All labs are normal. 

## 2016-09-08 ENCOUNTER — Encounter: Payer: Self-pay | Admitting: Family Medicine

## 2017-01-01 ENCOUNTER — Ambulatory Visit (INDEPENDENT_AMBULATORY_CARE_PROVIDER_SITE_OTHER): Payer: Medicare HMO | Admitting: Family Medicine

## 2017-01-01 ENCOUNTER — Encounter: Payer: Self-pay | Admitting: Family Medicine

## 2017-01-01 VITALS — BP 111/68 | HR 63 | Wt 168.0 lb

## 2017-01-01 DIAGNOSIS — W57XXXA Bitten or stung by nonvenomous insect and other nonvenomous arthropods, initial encounter: Secondary | ICD-10-CM

## 2017-01-01 DIAGNOSIS — R21 Rash and other nonspecific skin eruption: Secondary | ICD-10-CM

## 2017-01-01 MED ORDER — TRIAMCINOLONE ACETONIDE 0.5 % EX OINT: 1.0000 "application " | TOPICAL_OINTMENT | Freq: Two times a day (BID) | CUTANEOUS | 3 refills | Status: DC

## 2017-01-01 NOTE — Patient Instructions (Signed)
Thank you for coming in today. Apply Triamcinolone ointment to the rash twice daily until it settles down.  You can also use over the counter lamisil (terbinafine) cream for the rash.  Recheck if not better.

## 2017-01-01 NOTE — Progress Notes (Signed)
       ONEY FOLZ is a 71 y.o. male who presents to [facility] today for rash. Patient has a two-week history of itchy rash on his left leg. This gets worse after swimming. He thinks she was bitten by some insects. He denies any fevers or chills and feels well otherwise. He has not tried any treatment yet. He denies any pain.   Past Medical History:  Diagnosis Date  . AAA (abdominal aortic aneurysm) ([place])   . Hepatitis A 1998   with elevated LFT's  . Hyperlipidemia   . Hypertension   . IFG (impaired fasting glucose)    Past Surgical History:  Procedure Laterality Date  . arthroscopic surgery  1982   left knee   Social History  Substance Use Topics  . Smoking status: Never Smoker  . Smokeless tobacco: Never Used  . Alcohol use 0.0 oz/week   family history includes Heart disease in his brother and father; Hyperlipidemia in his brother, father, mother, and sister; Hypertension in his brother, father, mother, and sister.  ROS as above:  Medications: Current Outpatient Prescriptions  Medication Sig Dispense Refill  . aspirin 81 MG tablet Take 81 mg by mouth daily.    . fluticasone (FLONASE) 50 MCG/ACT nasal spray Place 2 sprays into both nostrils daily. 16 g 2  . lisinopril-hydrochlorothiazide (PRINZIDE,ZESTORETIC) 10-12.5 MG tablet Take 1 tablet by mouth daily. 90 tablet 3  . naproxen (NAPROSYN) 500 MG tablet Take 1 tablet (500 mg total) by mouth 2 (two) times daily with a meal. 60 tablet 3  . omeprazole (PRILOSEC) 40 MG capsule Take 1 capsule (40 mg total) by mouth daily. 30 capsule 3  . simvastatin (ZOCOR) 40 MG tablet TAKE 1 TABLET EVERY EVENING 90 tablet 3  . triamcinolone ointment (KENALOG) 0.5 % Apply 1 application topically 2 (two) times daily. To affected area, avoid eyes and face 30 g 3   No current facility-administered medications for this visit.     No Known Allergies  Health Maintenance Health Maintenance  Topic Date Due  . INFLUENZA VACCINE  01/01/2018 (Originally 11/01/2016)  . COLONOSCOPY  03/13/2017  . TETANUS/TDAP  04/18/2026  . Hepatitis C Screening  Completed  . PNA vac Low Risk Adult  Completed     Exam:  BP 111/68   Pulse 63   Wt 168 lb (76.2 kg)   BMI 27.12 kg/m  Gen: Well NAD HEENT: EOMI,  MMM Lungs: Normal work of breathing. CTABL Heart: RRR no MRG Abd: NABS, Soft. Nondistended, Nontender Exts: Brisk capillary refill, warm and well perfused. Skin: Erythematous blanching nontender rash left lower leg.    No results found for this or any previous visit (from the past 72 hour(s)). No results found.    Assessment and Plan: 71 y.o. male with rash. Likely irritant dermatitis or arthropod bite. This may be tinea corporis. Plan to treat with triamcinolone ointment. Recommended over-the-counter Lamisil as well. Recheck if not improving.  No orders of the defined types were placed in this encounter.  Meds ordered this encounter  Medications  . triamcinolone ointment (KENALOG) 0.5 %    Sig: Apply 1 application topically 2 (two) times daily. To affected area, avoid eyes and face    Dispense:  30 g    Refill:  3     Discussed warning signs or symptoms. Please see discharge instructions. Patient expresses understanding.

## 2017-02-07 ENCOUNTER — Ambulatory Visit: Payer: Medicare HMO | Admitting: Family Medicine

## 2017-02-07 ENCOUNTER — Encounter: Payer: Self-pay | Admitting: Family Medicine

## 2017-02-07 VITALS — BP 100/61 | HR 65 | Ht 66.0 in | Wt 163.0 lb

## 2017-02-07 DIAGNOSIS — Z1211 Encounter for screening for malignant neoplasm of colon: Secondary | ICD-10-CM | POA: Diagnosis not present

## 2017-02-07 DIAGNOSIS — R7301 Impaired fasting glucose: Secondary | ICD-10-CM

## 2017-02-07 DIAGNOSIS — I10 Essential (primary) hypertension: Secondary | ICD-10-CM

## 2017-02-07 LAB — COMPLETE METABOLIC PANEL WITH GFR
AG Ratio: 1.5 (calc) (ref 1.0–2.5)
ALKALINE PHOSPHATASE (APISO): 58 U/L (ref 40–115)
ALT: 16 U/L (ref 9–46)
AST: 47 U/L — ABNORMAL HIGH (ref 10–35)
Albumin: 4.8 g/dL (ref 3.6–5.1)
BUN: 18 mg/dL (ref 7–25)
CO2: 28 mmol/L (ref 20–32)
Calcium: 10 mg/dL (ref 8.6–10.3)
Chloride: 102 mmol/L (ref 98–110)
Creat: 0.94 mg/dL (ref 0.70–1.18)
GFR, EST NON AFRICAN AMERICAN: 81 mL/min/{1.73_m2} (ref >=60)
GFR, Est African American: 94 mL/min/{1.73_m2} (ref >=60)
Globulin: 3.3 g/dL (calc) (ref 1.9–3.7)
Glucose, Bld: 96 mg/dL (ref 65–99)
POTASSIUM: 4.3 mmol/L (ref 3.5–5.3)
Sodium: 137 mmol/L (ref 135–146)
Total Bilirubin: 1.1 mg/dL (ref 0.2–1.2)
Total Protein: 8.1 g/dL (ref 6.1–8.1)

## 2017-02-07 LAB — LIPID PANEL
CHOLESTEROL: 153 mg/dL (ref <200)
HDL: 56 mg/dL (ref >40)
LDL Cholesterol (Calc): 81 mg/dL (calc)
NON-HDL CHOLESTEROL (CALC): 97 mg/dL (ref <130)
TRIGLYCERIDES: 76 mg/dL (ref <150)
Total CHOL/HDL Ratio: 2.7 (calc) (ref <5.0)

## 2017-02-07 LAB — POCT GLYCOSYLATED HEMOGLOBIN (HGB A1C): HEMOGLOBIN A1C: 5.3

## 2017-02-07 MED ORDER — LISINOPRIL 10 MG PO TABS: 10.0000 mg | ORAL_TABLET | Freq: Every day | ORAL | 3 refills | Status: DC

## 2017-02-07 NOTE — Progress Notes (Addendum)
Subjective:    CC: BP   HPI:  Hypertension- Pt denies chest pain, SOB, dizziness, or heart palpitations.  Taking meds as directed w/o problems.  Denies medication side effects.    Impaired fasting glucose-no increased thirst or urination. No symptoms consistent with hypoglycemia.   Past medical history, Surgical history, Family history not pertinant except as noted below, Social history, Allergies, and medications have been entered into the medical record, reviewed, and corrections made.   Review of Systems: No fevers, chills, night sweats, weight loss, chest pain, or shortness of breath.   Objective:    General: Well Developed, well nourished, and in no acute distress.  Neuro: Alert and oriented x3, extra-ocular muscles intact, sensation grossly intact.  HEENT: Normocephalic, atraumatic  Skin: Warm and dry, no rashes. Cardiac: Regular rate and rhythm, no murmurs rubs or gallops, no lower extremity edema.  Respiratory: Clear to auscultation bilaterally. Not using accessory muscles, speaking in full sentences.   Impression and Recommendations:    HTN - well controlled. In fact BP is low. Wil d/c the hct. New Rx lisinopril 10 mg.  Check BP at home and monitor PRN.   IFG - Well controlled. Continue current regimen. Follow up in  6 months.   Due for screening colonoscopy in December.  He was seen at [facility] 10 years ago.  He would prefer to go back there as he now lives in [place].  New order placed.

## 2017-02-07 NOTE — Addendum Note (Signed)
Addended by: Beatrice Lecher D on: 02/07/2017 09:59 AM   Modules accepted: Orders

## 2017-02-08 ENCOUNTER — Other Ambulatory Visit: Payer: Self-pay | Admitting: Family Medicine

## 2017-02-08 MED ORDER — ATORVASTATIN CALCIUM 40 MG PO TABS: 40.0000 mg | ORAL_TABLET | Freq: Every day | ORAL | 3 refills | Status: DC

## 2017-02-08 NOTE — Progress Notes (Unsigned)
   Since that to [facility] mail order for atorvastatin in place of simvastatin for better LDL control.

## 2017-04-19 DIAGNOSIS — Z1211 Encounter for screening for malignant neoplasm of colon: Secondary | ICD-10-CM | POA: Diagnosis not present

## 2017-04-19 LAB — HM COLONOSCOPY

## 2017-04-27 ENCOUNTER — Encounter: Payer: Self-pay | Admitting: Family Medicine

## 2017-07-03 ENCOUNTER — Ambulatory Visit (INDEPENDENT_AMBULATORY_CARE_PROVIDER_SITE_OTHER): Payer: Medicare HMO | Admitting: Family Medicine

## 2017-07-03 ENCOUNTER — Encounter: Payer: Self-pay | Admitting: Family Medicine

## 2017-07-03 VITALS — BP 111/67 | HR 60 | Ht 66.0 in | Wt 167.0 lb

## 2017-07-03 DIAGNOSIS — J069 Acute upper respiratory infection, unspecified: Secondary | ICD-10-CM

## 2017-07-03 DIAGNOSIS — L237 Allergic contact dermatitis due to plants, except food: Secondary | ICD-10-CM

## 2017-07-03 MED ORDER — CLOBETASOL PROPIONATE 0.05 % EX CREA: 1.0000 "application " | TOPICAL_CREAM | Freq: Two times a day (BID) | CUTANEOUS | 0 refills | Status: DC

## 2017-07-03 MED ORDER — CLOBETASOL PROPIONATE 0.05 % EX CREA
1.0000 | TOPICAL_CREAM | Freq: Two times a day (BID) | CUTANEOUS | 0 refills | Status: DC
Start: 2017-07-03 — End: 2017-09-04

## 2017-07-03 NOTE — Progress Notes (Signed)
   Subjective:    Patient ID: Brad Cannon, male    DOB: 1945-06-02, 72 y.o.   MRN: [id number]  HPI  72 year old male comes in today complaining of a rash on both arms.  pt reports that he believes that he may have come in contact w/poison ivy or poison oak on sunday. rash on both forearms. he has been using triamcinalone cream. he reports that this has not helped and is still very itchy has not tried anything else.   Is also had some upper respiratory symptoms for about 9 days.  He says it started with nasal congestion and persistent productive cough.  He says he actually feels like he is getting a little better and feels like the mucus in his chest is actually breaking up worse before the cough was painful and felt like things were moving.  He says his wife is now sick.  No fever.  Review of Systems     Objective:   Physical Exam  Constitutional: He is oriented to person, place, and time. He appears well-developed and well-nourished.  HENT:  Head: Normocephalic and atraumatic.  Right Ear: External ear normal.  Left Ear: External ear normal.  Nose: Nose normal.  Mouth/Throat: Oropharynx is clear and moist.  TMs and canals are clear.   Eyes: Pupils are equal, round, and reactive to light. Conjunctivae and EOM are normal.  Neck: Neck supple. No thyromegaly present.  Cardiovascular: Normal rate and normal heart sounds.  Pulmonary/Chest: Effort normal and breath sounds normal.  Lymphadenopathy:    He has no cervical adenopathy.  Neurological: He is alert and oriented to person, place, and time.  Skin: Skin is warm and dry. Rash noted. No pallor.  On the forearms has a scattered erythematous papulovesicular rash.      Psychiatric: He has a normal mood and affect. His behavior is normal.  Vitals reviewed.       Assessment & Plan:  Poison ivy dermatitis-discussed options.  We can treat with a stronger topical since he is [place] been using triamcinolone 0.05% versus switching to  an oral steroid.  He would prefer to do stronger topical.  Number scription sent to pharmacy.  Call if any problems or if not improving.  Respiratory infection/bronchitis-if not feeling better by the end of the week then please give [place] a call back but it sounds like he is improving on his own.

## 2017-08-21 DIAGNOSIS — H2513 Age-related nuclear cataract, bilateral: Secondary | ICD-10-CM | POA: Diagnosis not present

## 2017-08-21 DIAGNOSIS — H43813 Vitreous degeneration, bilateral: Secondary | ICD-10-CM | POA: Diagnosis not present

## 2017-09-04 ENCOUNTER — Encounter: Payer: Self-pay | Admitting: Family Medicine

## 2017-09-04 ENCOUNTER — Ambulatory Visit (INDEPENDENT_AMBULATORY_CARE_PROVIDER_SITE_OTHER): Payer: Medicare HMO | Admitting: Family Medicine

## 2017-09-04 VITALS — BP 129/70 | HR 67 | Ht 66.0 in | Wt 167.0 lb

## 2017-09-04 DIAGNOSIS — R7301 Impaired fasting glucose: Secondary | ICD-10-CM

## 2017-09-04 DIAGNOSIS — R61 Generalized hyperhidrosis: Secondary | ICD-10-CM

## 2017-09-04 DIAGNOSIS — H6123 Impacted cerumen, bilateral: Secondary | ICD-10-CM

## 2017-09-04 DIAGNOSIS — I10 Essential (primary) hypertension: Secondary | ICD-10-CM

## 2017-09-04 DIAGNOSIS — Z1159 Encounter for screening for other viral diseases: Secondary | ICD-10-CM

## 2017-09-04 DIAGNOSIS — R972 Elevated prostate specific antigen [PSA]: Secondary | ICD-10-CM

## 2017-09-04 LAB — CBC WITH DIFFERENTIAL/PLATELET
BASOS PCT: 1 %
Basophils Absolute: 50 cells/uL (ref 0–200)
Eosinophils Absolute: 100 cells/uL (ref 15–500)
Eosinophils Relative: 2 %
HCT: 42.5 % (ref 38.5–50.0)
Hemoglobin: 14.6 g/dL (ref 13.2–17.1)
Lymphs Abs: 2185 cells/uL (ref 850–3900)
MCH: 32.7 pg (ref 27.0–33.0)
MCHC: 34.4 g/dL (ref 32.0–36.0)
MCV: 95.1 fL (ref 80.0–100.0)
MONOS PCT: 9.3 %
MPV: 12.2 fL (ref 7.5–12.5)
NEUTROS PCT: 44 %
Neutro Abs: 2200 cells/uL (ref 1500–7800)
Platelets: 186 10*3/uL (ref 140–400)
RBC: 4.47 10*6/uL (ref 4.20–5.80)
RDW: 12.3 % (ref 11.0–15.0)
Total Lymphocyte: 43.7 %
WBC mixed population: 465 cells/uL (ref 200–950)
WBC: 5 10*3/uL (ref 3.8–10.8)

## 2017-09-04 LAB — POCT GLYCOSYLATED HEMOGLOBIN (HGB A1C): HEMOGLOBIN A1C: 5.5 % (ref 4.0–5.6)

## 2017-09-04 LAB — BASIC METABOLIC PANEL WITH GFR
BUN: 11 mg/dL (ref 7–25)
CALCIUM: 9.8 mg/dL (ref 8.6–10.3)
CO2: 29 mmol/L (ref 20–32)
Chloride: 106 mmol/L (ref 98–110)
Creat: 0.88 mg/dL (ref 0.70–1.18)
GFR, Est African American: 99 mL/min/{1.73_m2} (ref >=60)
GFR, Est Non African American: 86 mL/min/{1.73_m2} (ref >=60)
GLUCOSE: 101 mg/dL (ref 65–139)
Potassium: 4.1 mmol/L (ref 3.5–5.3)
SODIUM: 140 mmol/L (ref 135–146)

## 2017-09-04 LAB — PSA: PSA: 1.4 ng/mL (ref <=4.0)

## 2017-09-04 NOTE — Progress Notes (Signed)
Subjective:    CC: Glucose, and BP   HPI: Impaired fasting glucose-no increased thirst or urination. No symptoms consistent with hypoglycemia.  Hypertension- Pt denies chest pain, SOB, dizziness, or heart palpitations.  Taking meds as directed w/o problems.  Denies medication side effects.  He says he is really not taking the aspirin.  Would like his ears irrigated. Says they get clogged every few years.    He is doing OK with spring allergies.  He has been waking up with night sweats the last couple weeks.  Is not every night.  But he is literally sweating.  He is not sure why he denies any cold or respiratory symptoms or any sign of fever.  No rash or chills.  Past medical history, Surgical history, Family history not pertinant except as noted below, Social history, Allergies, and medications have been entered into the medical record, reviewed, and corrections made.   Review of Systems: No fevers, chills, night sweats, weight loss, chest pain, or shortness of breath.   Objective:    General: Well Developed, well nourished, and in no acute distress.  Neuro: Alert and oriented x3, extra-ocular muscles intact, sensation grossly intact.  HEENT: Normocephalic, atraumatic.  Moderate amount amount of wax in the left ear canal able to visualize the TM.  Right canal is completely blocked with cerumen and unable to visualize the tympanic membrane at all. Skin: Warm and dry, no rashes. Cardiac: Regular rate and rhythm, no murmurs rubs or gallops, no lower extremity edema.  Respiratory: Clear to auscultation bilaterally. Not using accessory muscles, speaking in full sentences.   Impression and Recommendations:    IFG -  Well controlled. Continue current regimen. Follow up in  12 months.    Hypertension- Well controlled. Continue current regimen. Follow up in  6 months.   Cerumen impaction -lateral irrigation performed.  Patient tolerated well.  Night sweats -be secondary to recent weather  change but will check a CBC there is not having any other signs of infection and will also get an up-to-date PSA test since he is due.  If the night sweats persist over the next couple weeks and please let me know.  Indication: Cerumen impaction of the ear(s) Medical necessity statement: On physical examination, cerumen impairs clinically significant portions of the external auditory canal, and tympanic membrane. Noted obstructive, copious cerumen that cannot be removed without irrigation.  Consent: Discussed benefits and risks of procedure and verbal consent obtained Procedure: Patient was prepped for the procedure. Utilized an otoscope to assess and take note of the ear canal, the tympanic membrane, and the presence, amount, and placement of the cerumen. Gentle water irrigation and soft plastic curette was utilized to remove cerumen.  Post procedure examination: shows cerumen was completely removed. Patient tolerated procedure well. The patient is made aware that they may experience temporary vertigo, temporary hearing loss, and temporary discomfort. If these symptom last for more than 24 hours to call the clinic or proceed to the ED.

## 2017-09-05 NOTE — Progress Notes (Signed)
All labs are normal. 

## 2017-09-11 ENCOUNTER — Encounter: Payer: Self-pay | Admitting: Vascular Surgery

## 2017-09-11 ENCOUNTER — Ambulatory Visit (HOSPITAL_COMMUNITY)
Admission: RE | Admit: 2017-09-11 | Discharge: 2017-09-11 | Disposition: A | Discharge status: Home / Self Care | Payer: Medicare HMO | Source: Ambulatory Visit | Attending: Vascular Surgery | Admitting: Vascular Surgery

## 2017-09-11 ENCOUNTER — Other Ambulatory Visit: Payer: Self-pay

## 2017-09-11 ENCOUNTER — Ambulatory Visit: Payer: Medicare HMO | Admitting: Vascular Surgery

## 2017-09-11 VITALS — BP 135/89 | HR 61 | Resp 20 | Ht 66.0 in | Wt 164.0 lb

## 2017-09-11 DIAGNOSIS — I714 Abdominal aortic aneurysm, without rupture, unspecified: Secondary | ICD-10-CM

## 2017-09-11 DIAGNOSIS — I77819 Aortic ectasia, unspecified site: Secondary | ICD-10-CM

## 2017-09-11 NOTE — Progress Notes (Signed)
Vascular and Vein Specialist of [facility]  Patient name: Brad Cannon MRN: [id number] DOB: 08-28-1945 Sex: male  REASON FOR VISIT: Follow-up of the known aortic ectasia  HPI: Brad Cannon is a 72 y.o. male here today for follow-up.  [name] with ultrasound showing some dilatation of his infrarenal aorta.  I explained that this is not truly definition of aneurysm but was approaching this.  At his young age and health we had followed him with serial ultrasounds.  Last study was 3 years ago.  He is remained in excellent health.  Past Medical History:  Diagnosis Date  . AAA (abdominal aortic aneurysm) ([place])   . Hepatitis A 1998   with elevated LFT's  . Hyperlipidemia   . Hypertension   . IFG (impaired fasting glucose)     Family History  Problem Relation Age of Onset  . Hypertension Mother   . Hyperlipidemia Mother   . Heart disease Father   . Hyperlipidemia Father   . Hypertension Father   . Hypertension Sister   . Hyperlipidemia Sister   . Hyperlipidemia Brother   . Hypertension Brother   . Heart disease Brother     SOCIAL HISTORY: Social History   Tobacco Use  . Smoking status: Never Smoker  . Smokeless tobacco: Never Used  Substance Use Topics  . Alcohol use: Yes    Alcohol/week: 0.0 oz    No Known Allergies  Current Outpatient Medications  Medication Sig Dispense Refill  . atorvastatin (LIPITOR) 40 MG tablet Take 1 tablet (40 mg total) daily by mouth. 90 tablet 3  . fluticasone (FLONASE) 50 MCG/ACT nasal spray Place 2 sprays into both nostrils daily. 16 g 2  . lisinopril (PRINIVIL,ZESTRIL) 10 MG tablet Take 1 tablet (10 mg total) daily by mouth. 90 tablet 3   No current facility-administered medications for this visit.     REVIEW OF SYSTEMS:  [X]  denotes positive finding, [ ]  denotes negative finding Cardiac  Comments:  Chest pain or chest pressure:    Shortness of breath upon  exertion:    Short of breath when lying flat:    Irregular heart rhythm:        Vascular    Pain in calf, thigh, or hip brought on by ambulation:    Pain in feet at night that wakes you up from your sleep:     Blood clot in your veins:    Leg swelling:           PHYSICAL EXAM: Vitals:   09/11/17 0955  BP: 135/89  Pulse: 61  Resp: 20  SpO2: 96%  Weight: 164 lb (74.4 kg)  Height: 5\' 6"  (1.676 m)    GENERAL: The patient is a well-nourished male, in no acute distress. The vital signs are documented above. CARDIOVASCULAR: 2+ radial 2+ popliteal and 2+ dorsalis pedis pulses.  Abdominal exam reveals a palpable aortic pulse.  He is thin.  No tenderness PULMONARY: There is good air exchange  MUSCULOSKELETAL: There are no major deformities or cyanosis. NEUROLOGIC: No focal weakness or paresthesias are detected. SKIN: There are no ulcers or rashes noted. PSYCHIATRIC: The patient has a normal affect.  DATA:  Ultrasound today shows maximal diameter of 2.4 cm.  This compares with previous diameter 3 years ago of 2.5 cm.  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  He appears to have diffuse mild enlargement with no true aneurysm.  I have recommended discontinuation of follow-up since he has had no change in several years.  He is comfortable with this discussion.  Will see [place] again on an as-needed basis    Rosetta Posner, MD [facility] V[facility] and Ve[facility] Specialists of [facility] Tel [phone] Pager [phone]

## 2017-12-26 DIAGNOSIS — R69 Illness, unspecified: Secondary | ICD-10-CM | POA: Diagnosis not present

## 2018-03-21 ENCOUNTER — Ambulatory Visit (INDEPENDENT_AMBULATORY_CARE_PROVIDER_SITE_OTHER): Payer: Medicare HMO | Admitting: Family Medicine

## 2018-03-21 ENCOUNTER — Encounter: Payer: Self-pay | Admitting: Family Medicine

## 2018-03-21 VITALS — BP 116/66 | HR 58 | Ht 66.0 in | Wt 161.0 lb

## 2018-03-21 DIAGNOSIS — E785 Hyperlipidemia, unspecified: Secondary | ICD-10-CM | POA: Diagnosis not present

## 2018-03-21 DIAGNOSIS — I10 Essential (primary) hypertension: Secondary | ICD-10-CM | POA: Diagnosis not present

## 2018-03-21 DIAGNOSIS — R7301 Impaired fasting glucose: Secondary | ICD-10-CM | POA: Diagnosis not present

## 2018-03-21 LAB — POCT GLYCOSYLATED HEMOGLOBIN (HGB A1C): HEMOGLOBIN A1C: 5.6 % (ref 4.0–5.6)

## 2018-03-21 MED ORDER — LISINOPRIL 10 MG PO TABS: 10.0000 mg | ORAL_TABLET | Freq: Every day | ORAL | 3 refills | Status: DC

## 2018-03-21 MED ORDER — ATORVASTATIN CALCIUM 40 MG PO TABS: 40.0000 mg | ORAL_TABLET | Freq: Every day | ORAL | 3 refills | Status: DC

## 2018-03-21 NOTE — Progress Notes (Signed)
Subjective:    CC: BP and glucose  HPI: Hypertension- Pt denies chest pain, SOB, dizziness, or heart palpitations.  Taking meds as directed w/o problems.  Denies medication side effects.    Impaired fasting glucose-no increased thirst or urination. No symptoms consistent with hypoglycemia. He is still swimming 3 days per week and walking around [facility] 2 days per week.   Hyperlipidemia - doing well on statin with no S.E or myalgias.   Past medical history, Surgical history, Family history not pertinant except as noted below, Social history, Allergies, and medications have been entered into the medical record, reviewed, and corrections made.   Review of Systems: No fevers, chills, night sweats, weight loss, chest pain, or shortness of breath.   Objective:    General: Well Developed, well nourished, and in no acute distress.  Neuro: Alert and oriented x3, extra-ocular muscles intact, sensation grossly intact.  HEENT: Normocephalic, atraumatic  Skin: Warm and dry, no rashes. Cardiac: Regular rate and rhythm, no murmurs rubs or gallops, no lower extremity edema.  Respiratory: Clear to auscultation bilaterally. Not using accessory muscles, speaking in full sentences.   Impression and Recommendations:    IFG - Well controlled. Continue current regimen. Follow up in  34months.    HTN - Well controlled. Continue current regimen. Follow up in  6 moths.    Hyperlipidemia -due to recheck lipids.

## 2018-03-22 LAB — COMPLETE METABOLIC PANEL WITH GFR
AG Ratio: 1.7 (calc) (ref 1.0–2.5)
ALBUMIN MSPROF: 4.5 g/dL (ref 3.6–5.1)
ALT: 16 U/L (ref 9–46)
AST: 45 U/L — ABNORMAL HIGH (ref 10–35)
Alkaline phosphatase (APISO): 75 U/L (ref 40–115)
BUN: 19 mg/dL (ref 7–25)
CO2: 27 mmol/L (ref 20–32)
Calcium: 9.9 mg/dL (ref 8.6–10.3)
Chloride: 106 mmol/L (ref 98–110)
Creat: 0.95 mg/dL (ref 0.70–1.18)
GFR, EST AFRICAN AMERICAN: 92 mL/min/{1.73_m2} (ref >=60)
GFR, Est Non African American: 79 mL/min/{1.73_m2} (ref >=60)
Globulin: 2.6 g/dL (calc) (ref 1.9–3.7)
Glucose, Bld: 105 mg/dL — ABNORMAL HIGH (ref 65–99)
POTASSIUM: 4.4 mmol/L (ref 3.5–5.3)
Sodium: 140 mmol/L (ref 135–146)
Total Bilirubin: 1 mg/dL (ref 0.2–1.2)
Total Protein: 7.1 g/dL (ref 6.1–8.1)

## 2018-03-22 LAB — LIPID PANEL
CHOLESTEROL: 128 mg/dL (ref <200)
HDL: 43 mg/dL (ref >40)
LDL Cholesterol (Calc): 71 mg/dL (calc)
Non-HDL Cholesterol (Calc): 85 mg/dL (calc) (ref <130)
Total CHOL/HDL Ratio: 3 (calc) (ref <5.0)
Triglycerides: 61 mg/dL (ref <150)

## 2018-09-04 ENCOUNTER — Encounter: Payer: Self-pay | Admitting: Osteopathic Medicine

## 2018-09-04 ENCOUNTER — Ambulatory Visit (INDEPENDENT_AMBULATORY_CARE_PROVIDER_SITE_OTHER): Payer: Medicare HMO | Admitting: Osteopathic Medicine

## 2018-09-04 VITALS — BP 132/87 | HR 71 | Temp 98.1°F | Wt 161.9 lb

## 2018-09-04 DIAGNOSIS — L237 Allergic contact dermatitis due to plants, except food: Secondary | ICD-10-CM | POA: Diagnosis not present

## 2018-09-04 MED ORDER — CLOBETASOL PROPIONATE 0.05 % EX OINT: 1.0000 "application " | TOPICAL_OINTMENT | Freq: Two times a day (BID) | CUTANEOUS | 1 refills | Status: DC

## 2018-09-04 NOTE — Patient Instructions (Signed)
Poison Ivy Dermatitis  Poison ivy dermatitis is inflammation of the skin that is caused by the allergens on the leaves of the poison ivy plant. The skin reaction often involves redness, swelling, blisters, and extreme itching. What are the causes? This condition is caused by a specific chemical (urushiol) found in the sap of the poison ivy plant. This chemical is sticky and can be easily spread to people, animals, and objects. You can get poison ivy dermatitis by:  Having direct contact with a poison ivy plant.  Touching animals, other people, or objects that have come in contact with poison ivy and have the chemical on them. What increases the risk? This condition is more likely to develop in:  People who are outdoors often.  People who go outdoors without wearing protective clothing, such as closed shoes, long pants, and a long-sleeved shirt. What are the signs or symptoms? Symptoms of this condition include:  Redness and itching.  A rash that often includes bumps and blisters. The rash usually appears 48 hours after exposure.  Swelling. This may occur if the reaction is more severe. Symptoms usually last for 1-2 weeks. However, the first time you develop this condition, symptoms may last 3-4 weeks. How is this diagnosed? This condition may be diagnosed based on your symptoms and a physical exam. Your health care provider may also ask you about any recent outdoor activity. How is this treated? Treatment for this condition will vary depending on how severe it is. Treatment may include:  Hydrocortisone creams or calamine lotions to relieve itching.  Oatmeal baths to soothe the skin.  Over-the-counter antihistamine tablets.  Oral steroid medicine for more severe outbreaks. Follow these instructions at home:  Take or apply over-the-counter and prescription medicines only as told by your health care provider.  Wash exposed skin as soon as possible with soap and cold water.  Use  hydrocortisone creams or calamine lotion as needed to soothe the skin and relieve itching.  Take oatmeal baths as needed. Use colloidal oatmeal. You can get this at your local pharmacy or grocery store. Follow the instructions on the packaging.  Do not scratch or rub your skin.  While you have the rash, wash clothes right after you wear them. How is this prevented?   Learn to identify the poison ivy plant and avoid contact with the plant. This plant can be recognized by the number of leaves. Generally, poison ivy has three leaves with flowering branches on a single stem. The leaves are typically glossy, and they have jagged edges that come to a point at the front.  If you have been exposed to poison ivy, thoroughly wash with soap and water right away. You have about 30 minutes to remove the plant resin before it will cause the rash. Be sure to wash under your fingernails because any plant resin there will continue to spread the rash.  When hiking or camping, wear clothes that will help you to avoid exposure on the skin. This includes long pants, a long-sleeved shirt, tall socks, and hiking boots. You can also apply preventive lotion to your skin to help limit exposure.  If you suspect that your clothes or outdoor gear came in contact with poison ivy, rinse them off outside with a garden hose before you bring them inside your house. Contact a health care provider if:  You have open sores in the rash area.  You have more redness, swelling, or pain in the affected area.  You have redness that   spreads beyond the rash area.  You have fluid, blood, or pus coming from the affected area.  You have a fever.  You have a rash over a large area of your body.  You have a rash on your eyes, mouth, or genitals.  Your rash does not improve after a few days. Get help right away if:  Your face swells or your eyes swell shut.  You have trouble breathing.  You have trouble swallowing. This  information is not intended to replace advice given to you by your health care provider. Make sure you discuss any questions you have with your health care provider. Document Released: 03/17/2000 Document Revised: 08/31/2016 Document Reviewed: 08/26/2014 Elsevier Interactive Patient Education  2019 [employer]  

## 2018-09-04 NOTE — Progress Notes (Signed)
HPI: Brad Cannon is a 73 y.o. male who  has a past medical history of AAA (abdominal aortic aneurysm) ([place]), Hepatitis A (1998), Hyperlipidemia, Hypertension, and IFG (impaired fasting glucose).  he presents to [facility] today, 09/04/18,  for chief complaint of:  Rash   . Context: doing yard work 4 days ago  . Location: bilateral ankles . Quality: itching  . Severity: getting more intense itching, rash spreading  . Duration: 4 days . Timing: constant  . Modifying factors: clobetasol ointment helps, he had some left over from previous similar rash.        At today's visit 09/04/18 ... PMH, PSH, FH reviewed and updated as needed.  Current medication list and allergy/intolerance hx reviewed and updated as needed. (See remainder of HPI, ROS, Phys Exam below)   No results found.  No results found for this or any previous visit (from the past 72 hour(s)).        ASSESSMENT/PLAN: The encounter diagnosis was Poison ivy dermatitis.   Meds ordered this encounter  Medications  . clobetasol ointment (TEMOVATE) 0.05 %    Sig: Apply 1 application topically 2 (two) times daily. To affected area(s) as needed, max 2 weeks to avoid whitening/thinning skin    Dispense:  30 g    Refill:  1    Patient Instructions  Poison Ivy Dermatitis  Poison ivy dermatitis is inflammation of the skin that is caused by the allergens on the leaves of the poison ivy plant. The skin reaction often involves redness, swelling, blisters, and extreme itching. What are the causes? This condition is caused by a specific chemical (urushiol) found in the sap of the poison ivy plant. This chemical is sticky and can be easily spread to people, animals, and objects. You can get poison ivy dermatitis by:  Having direct contact with a poison ivy plant.  Touching animals, other people, or objects that have come in contact with poison ivy and have the chemical on them.  What increases the risk? This condition is more likely to develop in:  People who are outdoors often.  People who go outdoors without wearing protective clothing, such as closed shoes, long pants, and a long-sleeved shirt. What are the signs or symptoms? Symptoms of this condition include:  Redness and itching.  A rash that often includes bumps and blisters. The rash usually appears 48 hours after exposure.  Swelling. This may occur if the reaction is more severe. Symptoms usually last for 1-2 weeks. However, the first time you develop this condition, symptoms may last 3-4 weeks. How is this diagnosed? This condition may be diagnosed based on your symptoms and a physical exam. Your health care provider may also ask you about any recent outdoor activity. How is this treated? Treatment for this condition will vary depending on how severe it is. Treatment may include:  Hydrocortisone creams or calamine lotions to relieve itching.  Oatmeal baths to soothe the skin.  Over-the-counter antihistamine tablets.  Oral steroid medicine for more severe outbreaks. Follow these instructions at home:  Take or apply over-the-counter and prescription medicines only as told by your health care provider.  Wash exposed skin as soon as possible with soap and cold water.  Use hydrocortisone creams or calamine lotion as needed to soothe the skin and relieve itching.  Take oatmeal baths as needed. Use colloidal oatmeal. You can get this at your local pharmacy or grocery store. Follow the instructions on the packaging.  Do  not scratch or rub your skin.  While you have the rash, wash clothes right after you wear them. How is this prevented?   Learn to identify the poison ivy plant and avoid contact with the plant. This plant can be recognized by the number of leaves. Generally, poison ivy has three leaves with flowering branches on a single stem. The leaves are typically glossy, and they have jagged  edges that come to a point at the front.  If you have been exposed to poison ivy, thoroughly wash with soap and water right away. You have about 30 minutes to remove the plant resin before it will cause the rash. Be sure to wash under your fingernails because any plant resin there will continue to spread the rash.  When hiking or camping, wear clothes that will help you to avoid exposure on the skin. This includes long pants, a long-sleeved shirt, tall socks, and hiking boots. You can also apply preventive lotion to your skin to help limit exposure.  If you suspect that your clothes or outdoor gear came in contact with poison ivy, rinse them off outside with a garden hose before you bring them inside your house. Contact a health care provider if:  You have open sores in the rash area.  You have more redness, swelling, or pain in the affected area.  You have redness that spreads beyond the rash area.  You have fluid, blood, or pus coming from the affected area.  You have a fever.  You have a rash over a large area of your body.  You have a rash on your eyes, mouth, or genitals.  Your rash does not improve after a few days. Get help right away if:  Your face swells or your eyes swell shut.  You have trouble breathing.  You have trouble swallowing. This information is not intended to replace advice given to you by your health care provider. Make sure you discuss any questions you have with your health care provider. Document Released: 03/17/2000 Document Revised: 08/31/2016 Document Reviewed: 08/26/2014 Elsevier Interactive Patient Education  2019 [employer].      Follow-up plan: Return if symptoms worsen or fail to improve.                                                 ################################################# ################################################# #################################################  #################################################    Current Meds  Medication Sig  . atorvastatin (LIPITOR) 40 MG tablet Take 1 tablet (40 mg total) by mouth daily.  . fluticasone (FLONASE) 50 MCG/ACT nasal spray Place 2 sprays into both nostrils daily.  Marland Kitchen lisinopril (PRINIVIL,ZESTRIL) 10 MG tablet Take 1 tablet (10 mg total) by mouth daily.    No Known Allergies     Review of Systems:  Constitutional: No recent illness  Musculoskeletal: No new myalgia/arthralgia  Skin: +Rash  Neurologic: No  weakness, No  Dizziness   Exam:  BP 132/87 (BP Location: Left Arm, Patient Position: Sitting, Cuff Size: Normal)   Pulse 71   Temp 98.1 F (36.7 C) (Oral)   Wt 161 lb 14.4 oz (73.4 kg)   BMI 26.13 kg/m   Constitutional: VS see above. General Appearance: alert, well-developed, well-nourished, NAD  Eyes: Normal lids and conjunctive, non-icteric sclera  Ears, Nose, Mouth, Throat: MMM, Normal external inspection ears/nares/mouth/lips/gums.  Neck: No masses, trachea midline.   Respiratory: Normal  respiratory effort.   Musculoskeletal: Gait normal. Symmetric and independent movement of all extremities  Neurological: Normal balance/coordination. No tremor.  Skin: warm, dry, intact. Blistering rash on posterior LLE c/w poison ivy dermatitis   Psychiatric: Normal judgment/insight. Normal mood and affect. Oriented x3.       Visit summary with medication list and pertinent instructions was printed for patient to review, patient was advised to alert [place] if any updates are needed. All questions at time of visit were answered - patient instructed to contact office with any additional concerns. ER/RTC precautions were reviewed with the patient and understanding verbalized.      Please note: voice recognition software was used to produce this document, and typos may escape review. Please contact Dr. Sheppard Coil for any needed clarifications.    Follow up plan: Return if symptoms  worsen or fail to improve.

## 2018-09-19 ENCOUNTER — Ambulatory Visit: Payer: Medicare HMO | Admitting: Family Medicine

## 2018-09-26 ENCOUNTER — Encounter: Payer: Self-pay | Admitting: Family Medicine

## 2018-09-26 ENCOUNTER — Ambulatory Visit (INDEPENDENT_AMBULATORY_CARE_PROVIDER_SITE_OTHER): Payer: Medicare HMO | Admitting: Family Medicine

## 2018-09-26 VITALS — BP 120/71 | HR 65 | Ht 66.0 in | Wt 161.0 lb

## 2018-09-26 DIAGNOSIS — M79675 Pain in left toe(s): Secondary | ICD-10-CM

## 2018-09-26 DIAGNOSIS — R740 Nonspecific elevation of levels of transaminase and lactic acid dehydrogenase [LDH]: Secondary | ICD-10-CM

## 2018-09-26 DIAGNOSIS — I77811 Abdominal aortic ectasia: Secondary | ICD-10-CM | POA: Insufficient documentation

## 2018-09-26 DIAGNOSIS — R7301 Impaired fasting glucose: Secondary | ICD-10-CM

## 2018-09-26 DIAGNOSIS — R7401 Elevation of levels of liver transaminase levels: Secondary | ICD-10-CM

## 2018-09-26 DIAGNOSIS — I10 Essential (primary) hypertension: Secondary | ICD-10-CM | POA: Diagnosis not present

## 2018-09-26 LAB — POCT GLYCOSYLATED HEMOGLOBIN (HGB A1C): Hemoglobin A1C: 5.5 % (ref 4.0–5.6)

## 2018-09-26 NOTE — Assessment & Plan Note (Signed)
A1C is fantasitc. Encouraged him to get back on track with regular exercise.

## 2018-09-26 NOTE — Assessment & Plan Note (Signed)
Overdue to recheck size on [place]. Will schedule. Given to [place] to schedule.

## 2018-09-26 NOTE — Progress Notes (Addendum)
Established Patient Office Visit  Subjective:  Patient ID: Brad Cannon, male    DOB: 05/24/1945  Age: 73 y.o. MRN: [id number]  CC:  Chief Complaint  Patient presents with  . Hypertension  . ifg    HPI Brad Cannon presents for  Hypertension- Pt denies chest pain, SOB, dizziness, or heart palpitations. Taking meds as directed w/o problems.  Denies medication side effects.    Impaired fasting glucose-no increased thirst or urination. No symptoms consistent with hypoglycemia.  Also due for follow-up of elevated AST.  He does have a past medical history of hepatitis A.  He drinks a couple of drinks on the weekend.    He also says for about the last 3 to 4 months he has had some occasional problems with his right big toe.  He says it will be uncomfortable and maybe can throb a little bit just for couple of days and then it seems to go away.  He denies any redness or swelling or injury.  He also complains of some left mid back pain.  Says it bothers him occasionally.  No radiculopathy.   Past Medical History:  Diagnosis Date  . AAA (abdominal aortic aneurysm) ([place])   . Hepatitis A 1998   with elevated LFT's  . Hyperlipidemia   . Hypertension   . IFG (impaired fasting glucose)     Past Surgical History:  Procedure Laterality Date  . arthroscopic surgery  1982   left knee    Family History  Problem Relation Age of Onset  . Hypertension Mother   . Hyperlipidemia Mother   . Heart disease Father   . Hyperlipidemia Father   . Hypertension Father   . Hypertension Sister   . Hyperlipidemia Sister   . Hyperlipidemia Brother   . Hypertension Brother   . Heart disease Brother     Social History   Socioeconomic History  . Marital status: Married    Spouse name: Not on file  . Number of children: Not on file  . Years of education: Not on file  . Highest education level: Not on file  Occupational History  . Not on file  Social Needs  . Financial resource  strain: Not on file  . Food insecurity    Worry: Not on file    Inability: Not on file  . Transportation needs    Medical: Not on file    Non-medical: Not on file  Tobacco Use  . Smoking status: Never Smoker  . Smokeless tobacco: Never Used  Substance and Sexual Activity  . Alcohol use: Yes    Alcohol/week: 0.0 standard drinks  . Drug use: No  . Sexual activity: Not on file  Lifestyle  . Physical activity    Days per week: Not on file    Minutes per session: Not on file  . Stress: Not on file  Relationships  . Social Herbalist on phone: Not on file    Gets together: Not on file    Attends religious service: Not on file    Active member of club or organization: Not on file    Attends meetings of clubs or organizations: Not on file    Relationship status: Not on file  . Intimate partner violence    Fear of current or ex partner: Not on file    Emotionally abused: Not on file    Physically abused: Not on file    Forced sexual activity: Not on file  Other Topics Concern  . Not on file  Social History Narrative   Works out regularly.  No caffeine.      Outpatient Medications Prior to Visit  Medication Sig Dispense Refill  . atorvastatin (LIPITOR) 40 MG tablet Take 1 tablet (40 mg total) by mouth daily. 90 tablet 3  . fluticasone (FLONASE) 50 MCG/ACT nasal spray Place 2 sprays into both nostrils daily. 16 g 2  . lisinopril (PRINIVIL,ZESTRIL) 10 MG tablet Take 1 tablet (10 mg total) by mouth daily. 90 tablet 3  . clobetasol ointment (TEMOVATE) 7.32 % Apply 1 application topically 2 (two) times daily. To affected area(s) as needed, max 2 weeks to avoid whitening/thinning skin 30 g 1   No facility-administered medications prior to visit.     No Known Allergies  ROS Review of Systems    Objective:    Physical Exam  Constitutional: He is oriented to person, place, and time. He appears well-developed and well-nourished.  HENT:  Head: Normocephalic and  atraumatic.  Cardiovascular: Normal rate, regular rhythm and normal heart sounds.  Pulmonary/Chest: Effort normal and breath sounds normal.  Neurological: He is alert and oriented to person, place, and time.  Skin: Skin is warm and dry.  Psychiatric: He has a normal mood and affect. His behavior is normal.    BP 120/71   Pulse 65   Ht 5\' 6"  (1.676 m)   Wt 161 lb (73 kg)   SpO2 98%   BMI 25.99 kg/m  Wt Readings from Last 3 Encounters:  09/26/18 161 lb (73 kg)  09/04/18 161 lb 14.4 oz (73.4 kg)  03/21/18 161 lb (73 kg)     There are no preventive care reminders to display for this patient.  There are no preventive care reminders to display for this patient.  No results found for: TSH Lab Results  Component Value Date   WBC 5.0 09/04/2017   HGB 14.6 09/04/2017   HCT 42.5 09/04/2017   MCV 95.1 09/04/2017   PLT 186 09/04/2017   Lab Results  Component Value Date   NA 140 03/21/2018   K 4.4 03/21/2018   CO2 27 03/21/2018   GLUCOSE 105 (H) 03/21/2018   BUN 19 03/21/2018   CREATININE 0.95 03/21/2018   BILITOT 1.0 03/21/2018   ALKPHOS 51 04/12/2016   AST 45 (H) 03/21/2018   ALT 16 03/21/2018   PROT 7.1 03/21/2018   ALBUMIN 4.3 04/12/2016   CALCIUM 9.9 03/21/2018   Lab Results  Component Value Date   CHOL 128 03/21/2018   Lab Results  Component Value Date   HDL 43 03/21/2018   Lab Results  Component Value Date   LDLCALC 71 03/21/2018   Lab Results  Component Value Date   TRIG 61 03/21/2018   Lab Results  Component Value Date   CHOLHDL 3.0 03/21/2018   Lab Results  Component Value Date   HGBA1C 5.5 09/26/2018      Assessment & Plan:   Problem List Items Addressed This Visit      Cardiovascular and Mediastinum   HYPERTENSION, BENIGN ESSENTIAL - Primary    Well controlled. Continue current regimen. Follow up in  6 months.        Relevant Orders   COMPLETE METABOLIC PANEL WITH GFR   Uric acid   Abdominal aortic ectasia ([place])    Overdue to  recheck size on [place]. Will schedule. Given to [place] to schedule.        Relevant Orders   US AORTA  Endocrine   IFG (impaired fasting glucose)    A1C is fantasitc. Encouraged him to get back on track with regular exercise.        Relevant Orders   COMPLETE METABOLIC PANEL WITH GFR   POCT glycosylated hemoglobin (Hb A1C) (Completed)     Other   Elevated AST (SGOT)    Will continue to follow liver enzymes. Consider [place] for further work up.        Relevant Orders   COMPLETE METABOLIC PANEL WITH GFR    Other Visit Diagnoses    Great toe pain, left         Great toe pain-likely osteoarthritis since it just seems to be uncomfortable for couple days each time it flares but will go ahead and check a uric acid that there is no erythema and swelling with the episodes.  Left mid back pain-given some exercises and stretches to do on his own at home.  If not improving then please let me know.  No orders of the defined types were placed in this encounter.   Follow-up: Return in about 6 months (around 03/28/2019) for BP and IFG.    Beatrice Lecher, MD

## 2018-09-26 NOTE — Assessment & Plan Note (Signed)
Well controlled. Continue current regimen. Follow up in  6 months.  

## 2018-09-26 NOTE — Assessment & Plan Note (Signed)
Will continue to follow liver enzymes. Consider [place] for further work up.

## 2018-09-27 LAB — COMPLETE METABOLIC PANEL WITH GFR
AG Ratio: 1.5 (calc) (ref 1.0–2.5)
ALT: 17 U/L (ref 9–46)
AST: 40 U/L — ABNORMAL HIGH (ref 10–35)
Albumin: 4.1 g/dL (ref 3.6–5.1)
Alkaline phosphatase (APISO): 80 U/L (ref 35–144)
BUN: 17 mg/dL (ref 7–25)
CO2: 27 mmol/L (ref 20–32)
Calcium: 9.5 mg/dL (ref 8.6–10.3)
Chloride: 106 mmol/L (ref 98–110)
Creat: 0.85 mg/dL (ref 0.70–1.18)
GFR, Est African American: 100 mL/min/{1.73_m2} (ref >=60)
GFR, Est Non African American: 86 mL/min/{1.73_m2} (ref >=60)
Globulin: 2.7 g/dL (calc) (ref 1.9–3.7)
Glucose, Bld: 101 mg/dL — ABNORMAL HIGH (ref 65–99)
Potassium: 4.3 mmol/L (ref 3.5–5.3)
Sodium: 139 mmol/L (ref 135–146)
Total Bilirubin: 0.7 mg/dL (ref 0.2–1.2)
Total Protein: 6.8 g/dL (ref 6.1–8.1)

## 2018-09-27 LAB — URIC ACID: Uric Acid, Serum: 5.2 mg/dL (ref 4.0–8.0)

## 2018-10-03 ENCOUNTER — Other Ambulatory Visit: Payer: Medicare HMO

## 2018-10-10 ENCOUNTER — Other Ambulatory Visit: Payer: Self-pay

## 2018-10-10 ENCOUNTER — Ambulatory Visit (INDEPENDENT_AMBULATORY_CARE_PROVIDER_SITE_OTHER): Payer: Medicare HMO

## 2018-10-10 DIAGNOSIS — I77811 Abdominal aortic ectasia: Secondary | ICD-10-CM

## 2018-11-19 NOTE — Progress Notes (Addendum)
Subjective:   Brad Cannon is a 73 y.o. male who presents for an Initial Medicare Annual Wellness Visit.  Review of Systems  No ROS.  Medicare Wellness Virtual Visit.  Visual/audio telehealth visit, UTA vital signs.   See social history for additional risk factors.    Cardiac Risk Factors include: advanced age (>35men, >61 women);hypertension;dyslipidemia;male gender  Sleep patterns: Getting 8 hours of sleep a night. Wakes up 1 time a night to void. Wakes up and feels refreshed.   Home Safety/Smoke Alarms: Feels safe in home. Smoke alarms in place.  Living environment; Lives with wife in 2 story home and steps have handrails on them. Shower is a walk in shower with grab bars in place Seat Belt Safety/Bike Helmet: Wears seat belt.   Male:   CCS- UTD    PSA-  UTD Lab Results  Component Value Date   PSA 1.4 09/04/2017   PSA 1.3 08/22/2016   PSA 1.66 08/31/2015       Objective:    Today's Vitals   11/26/18 1429  BP: 131/78  Pulse: 89  SpO2: 98%  Weight: 160 lb (72.6 kg)  Height: 5\' 6"  (1.676 m)   Body mass index is 25.82 kg/m.  Advanced Directives 11/26/2018 09/08/2014  Does Patient Have a Medical Advance Directive? No No  Would patient like information on creating a medical advance directive? Yes (MAU/Ambulatory/Procedural Areas - Information given) Yes - Educational materials given    Current Medications (verified) Outpatient Encounter Medications as of 11/26/2018  Medication Sig  . atorvastatin (LIPITOR) 40 MG tablet Take 1 tablet (40 mg total) by mouth daily.  Marland Kitchen lisinopril (PRINIVIL,ZESTRIL) 10 MG tablet Take 1 tablet (10 mg total) by mouth daily.  . fluticasone (FLONASE) 50 MCG/ACT nasal spray Place 2 sprays into both nostrils daily. (Patient not taking: Reported on 11/26/2018)   No facility-administered encounter medications on file as of 11/26/2018.     Allergies (verified) Patient has no known allergies.   History: Past Medical History:  Diagnosis  Date  . AAA (abdominal aortic aneurysm) ([place])   . Hepatitis A 1998   with elevated LFT's  . Hyperlipidemia   . Hypertension   . IFG (impaired fasting glucose)    Past Surgical History:  Procedure Laterality Date  . arthroscopic surgery  1982   left knee   Family History  Problem Relation Age of Onset  . Hypertension Mother   . Hyperlipidemia Mother   . Heart disease Father   . Hyperlipidemia Father   . Hypertension Father   . Hypertension Sister   . Hyperlipidemia Sister   . Hyperlipidemia Brother   . Hypertension Brother   . Heart disease Brother    Social History   Socioeconomic History  . Marital status: Married    Spouse name: Brad Cannon  . Number of children: 2  . Years of education: 57  . Highest education level: Master's degree (e.g., MA, MS, MEng, MEd, MSW, MBA)  Occupational History  . 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 Financial resource strain: Not hard at all  . Food insecurity    Worry: Never true    Inability: Never true  . Transportation needs    Medical: No    Non-medical: No  Tobacco Use  . Smoking status: Never Smoker  . Smokeless tobacco: Never Used  Substance and Sexual Activity  . Alcohol use: Not on file  . Drug use: No  . Sexual activity: Not Currently  Lifestyle  . Physical activity    Days per week: 6 days    Minutes per session: 120 min  . Stress: Not at all  Relationships  . Social connections    Talks on phone: Once a week    Gets together: Three times a week    Attends religious service: Never    Active member of club or organization: No    Attends meetings of clubs or organizations: Never    Relationship status: Married  Other Topics Concern  . Not on file  Social History Narrative   Works out regularly.  No caffeine.     Tobacco Counseling Counseling given: No   Clinical Intake:  Pre-visit preparation completed: Yes  Pain : No/denies pain     Nutritional Risks: None Diabetes:  No  How often do you need to have someone help you when you read instructions, pamphlets, or other written materials from your doctor or pharmacy?: 1 - Never What is the last grade level you completed in school?: 18  Interpreter Needed?: No  Information entered by :: Brad Dakin, LPN  Activities of Daily Living In your present state of health, do you have any difficulty performing the following activities: 11/26/2018  Hearing? N  Vision? N  Difficulty concentrating or making decisions? N  Walking or climbing stairs? N  Dressing or bathing? N  Doing errands, shopping? N  Preparing Food and eating ? N  Using the Toilet? N  In the past six months, have you accidently leaked urine? N  Do you have problems with loss of bowel control? N  Managing your Medications? N  Managing your Finances? N  Housekeeping or managing your Housekeeping? N  Some recent data might be hidden     Immunizations and Health Maintenance Immunization History  Administered Date(s) Administered  . Influenza Split 01/01/2012  . Influenza Whole 02/21/2006  . Influenza, High Dose Seasonal PF 01/23/2017, 01/22/2018  . Influenza,inj,Quad PF,6+ Mos 12/25/2013, 01/22/2018  . Influenza-Unspecified 10-13-1945, 01/15/2013, 01/12/2015, 02/03/2016  . Pneumococcal Conjugate-13 02/18/2014  . Pneumococcal Polysaccharide-23 11/28/2011  . Td 02/21/2006  . Tdap 04/18/2016  . Zoster 10/09/2008   Health Maintenance Due  Topic Date Due  . INFLUENZA VACCINE  11/02/2018    Patient Care Team: Brad Marry, MD as PCP - General (Family Medicine)  Indicate any recent Medical Services you may have received from other than [facility] providers in the past year (date may be approximate).    Assessment:   This is a routine wellness examination for Brad Cannon.Physical assessment deferred to PCP.   Hearing/Vision screen  Hearing Screening   125Hz  250Hz  500Hz  1000Hz  2000Hz  3000Hz  4000Hz  6000Hz  8000Hz   Right ear:           Left  ear:           Comments: Hearing test done by whisper test and patient repeated all 3 words back correctly   Visual Acuity Screening   Right eye Left eye Both eyes  Without correction:     With correction: 20/30 20/30 20/30     Dietary issues and exercise activities discussed: Current Exercise Habits: Structured exercise class, Type of exercise: walking(swimming 3 days a week), Time (Minutes): > 60, Frequency (Times/Week): 6, Weekly Exercise (Minutes/Week): 0, Intensity: Moderate, Exercise limited by: None identified Diet  Eats a healthy diet Breakfast: oatmeal and Lunch: sandwich whole wheat bread  Dinner: Meat and vegetables and potatoes. Eats a lot of peaches and fruit daily. Drinks water daily.  Goals    . Patient Stated     Patient stated wants to try and drink less alcohol for the rest of the year      Depression Screen PHQ 2/9 Scores 11/26/2018 03/21/2018 09/04/2017 02/07/2017  PHQ - 2 Score 0 0 0 0    Fall Risk Fall Risk  11/26/2018 03/21/2018 09/04/2017 08/22/2016 08/31/2015  Falls in the past year? 0 0 No No No  Number falls in past yr: - 0 - - -  Follow up Falls prevention discussed - - - -    Is the patient's home free of loose throw rugs in walkways, pet beds, electrical cords, etc?   yes      Grab bars in the bathroom? yes      Handrails on the stairs?   yes      Adequate lighting?   yes  Cognitive Function:     6CIT Screen 11/26/2018  What Year? 0 points  What month? 0 points  What time? 0 points  Count back from 20 0 points  Months in reverse 0 points  Repeat phrase 0 points  Total Score 0    Screening Tests Health Maintenance  Topic Date Due  . INFLUENZA VACCINE  11/02/2018  . TETANUS/TDAP  04/18/2026  . COLONOSCOPY  04/20/2027  . Hepatitis C Screening  Completed  . PNA vac Low Risk Adult  Completed        Plan:      Mr. Engram , Thank you for taking time to come for your Medicare Wellness Visit. I appreciate your ongoing commitment to  your health goals. Please review the following plan we discussed and let me know if I can assist you in the future.  Please schedule your next medicare wellness visit with me in 1 yr. Bring a copy of your living will and/or healthcare power of attorney to your next office visit.   These are the goals we discussed: Goals    . Patient Stated     Patient stated wants to try and drink less alcohol for the rest of the year       This is a list of the screening recommended for you and due dates:  Health Maintenance  Topic Date Due  . Flu Shot  11/02/2018  . Tetanus Vaccine  04/18/2026  . Colon Cancer Screening  04/20/2027  .  Hepatitis C: One time screening is recommended by Center for Disease Control  (CDC) for  adults born from [birth year] through 1966.   Completed  . Pneumonia vaccines  Completed     I have personally reviewed and noted the following in the patient's chart:   . Medical and social history . Use of alcohol, tobacco or illicit drugs  . Current medications and supplements . Functional ability and status . Nutritional status . Physical activity . Advanced directives . List of other physicians . Hospitalizations, surgeries, and ER visits in previous 12 months . Vitals . Screenings to include cognitive, depression, and falls . Referrals and appointments  In addition, I have reviewed and discussed with patient certain preventive protocols, quality metrics, and best practice recommendations. A written personalized care plan for preventive services as well as general preventive health recommendations were provided to patient.     Joanne Chars, LPN   [id number]/75/[id number]   Medical screening examination/treatment was performed by qualified clinical staff member and as supervising physician I was immediately available for consultation/collaboration. I have reviewed documentation and agree with assessment and plan.  Beatrice Lecher, MD

## 2018-11-26 ENCOUNTER — Ambulatory Visit (INDEPENDENT_AMBULATORY_CARE_PROVIDER_SITE_OTHER): Payer: Medicare HMO | Admitting: *Deleted

## 2018-11-26 ENCOUNTER — Other Ambulatory Visit: Payer: Self-pay

## 2018-11-26 VITALS — BP 131/78 | HR 89 | Ht 66.0 in | Wt 160.0 lb

## 2018-11-26 DIAGNOSIS — Z0000 Encounter for general adult medical examination without abnormal findings: Secondary | ICD-10-CM | POA: Diagnosis not present

## 2018-11-26 NOTE — Patient Instructions (Signed)
Mr. Paolini , Thank you for taking time to come for your Medicare Wellness Visit. I appreciate your ongoing commitment to your health goals. Please review the following plan we discussed and let me know if I can assist you in the future.  Please schedule your next medicare wellness visit with me in 1 yr. Bring a copy of your living will and/or healthcare power of attorney to your next office visit. These are the goals we discussed: Goals    . Patient Stated     Patient stated wants to try and drink less alcohol for the rest of the year

## 2019-03-31 ENCOUNTER — Encounter: Payer: Self-pay | Admitting: Family Medicine

## 2019-03-31 ENCOUNTER — Ambulatory Visit (INDEPENDENT_AMBULATORY_CARE_PROVIDER_SITE_OTHER): Payer: Medicare HMO | Admitting: Family Medicine

## 2019-03-31 ENCOUNTER — Other Ambulatory Visit: Payer: Self-pay

## 2019-03-31 VITALS — BP 127/58 | HR 56 | Ht 66.0 in | Wt 157.0 lb

## 2019-03-31 DIAGNOSIS — L821 Other seborrheic keratosis: Secondary | ICD-10-CM

## 2019-03-31 DIAGNOSIS — I10 Essential (primary) hypertension: Secondary | ICD-10-CM

## 2019-03-31 DIAGNOSIS — R972 Elevated prostate specific antigen [PSA]: Secondary | ICD-10-CM

## 2019-03-31 DIAGNOSIS — R7301 Impaired fasting glucose: Secondary | ICD-10-CM

## 2019-03-31 LAB — POCT GLYCOSYLATED HEMOGLOBIN (HGB A1C): Hemoglobin A1C: 4.7 % (ref 4.0–5.6)

## 2019-03-31 NOTE — Progress Notes (Addendum)
Established Patient Office Visit  Subjective:  Patient ID: Brad Cannon, male    DOB: [birth year]  Age: 73 y.o. MRN: [id number]  CC:  Chief Complaint  Patient presents with  . Hypertension  . IFG    HPI Brad Cannon presents for   Hypertension- Pt denies chest pain, SOB, dizziness, or heart palpitations.  Taking meds as directed w/o problems.  Denies medication side effects.  He has been swimming for about 1.5 hours most days. Has lost a couple more pounds.    Impaired fasting glucose-no increased thirst or urination. No symptoms consistent with hypoglycemia.  Due to recheck PSA.    He recently started taking Elderberry juice and wanted to ask about turmeric.   Also has a lesion on his scalp on the top left side as he is balding in that area.  He says that sometimes it feels crusty and peels off but always seems to return.  He denies any bleeding or tenderness.     Past Medical History:  Diagnosis Date  . AAA (abdominal aortic aneurysm) ([place])   . Hepatitis A 1998   with elevated LFT's  . Hyperlipidemia   . Hypertension   . IFG (impaired fasting glucose)     Past Surgical History:  Procedure Laterality Date  . arthroscopic surgery  1982   left knee    Family History  Problem Relation Age of Onset  . Hypertension Mother   . Hyperlipidemia Mother   . Heart disease Father   . Hyperlipidemia Father   . Hypertension Father   . Hypertension Sister   . Hyperlipidemia Sister   . Hyperlipidemia Brother   . Hypertension Brother   . Heart disease Brother     Social History   Socioeconomic History  . Marital status: Married    Spouse name: Santiago Glad  . Number of children: 2  . Years of education: 27  . Highest education level: Master's degree (e.g., MA, MS, MEng, MEd, MSW, MBA)  Occupational History  . 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 Smoking status: Never Smoker  . Smokeless tobacco: Never Used  Substance and Sexual  Activity  . Alcohol use: Not on file  . Drug use: No  . Sexual activity: Not Currently  Other Topics Concern  . Not on file  Social History Narrative   Works out regularly.  No caffeine.     Social Determinants of Health   Financial Resource Strain: Low Risk   . Difficulty of Paying Living Expenses: Not hard at all  Food Insecurity: No Food Insecurity  . Worried About Charity fundraiser in the Last Year: Never true  . Ran Out of Food in the Last Year: Never true  Transportation Needs: No Transportation Needs  . Lack of Transportation (Medical): No  . Lack of Transportation (Non-Medical): No  Physical Activity: Sufficiently Active  . Days of Exercise per Week: 6 days  . Minutes of Exercise per Session: 120 min  Stress: No Stress Concern Present  . Feeling of Stress : Not at all  Social Connections: Somewhat Isolated  . Frequency of Communication with Friends and Family: Once a week  . Frequency of Social Gatherings with Friends and Family: Three times a week  . Attends Religious Services: Never  . Active Member of Clubs or Organizations: No  . Attends Archivist Meetings: Never  . Marital Status: Married  Human resources officer Violence: Not At Risk  . Fear  of Current or Ex-Partner: No  . Emotionally Abused: No  . Physically Abused: No  . Sexually Abused: No    Outpatient Medications Prior to Visit  Medication Sig Dispense Refill  . atorvastatin (LIPITOR) 40 MG tablet Take 1 tablet (40 mg total) by mouth daily. 90 tablet 3  . fluticasone (FLONASE) 50 MCG/ACT nasal spray Place 2 sprays into both nostrils daily. 16 g 2  . lisinopril (PRINIVIL,ZESTRIL) 10 MG tablet Take 1 tablet (10 mg total) by mouth daily. 90 tablet 3   No facility-administered medications prior to visit.    No Known Allergies  ROS Review of Systems    Objective:    Physical Exam  Constitutional: He is oriented to person, place, and time. He appears well-developed and well-nourished.  HENT:   Head: Normocephalic and atraumatic.  Cardiovascular: Normal rate, regular rhythm and normal heart sounds.  Pulmonary/Chest: Effort normal and breath sounds normal.  Neurological: He is alert and oriented to person, place, and time.  Skin: Skin is warm and dry.  approx 1.0 x  1.5 cm lesion on the top of his scalp with a white thick scale and some pigmentation around the border.  Psychiatric: He has a normal mood and affect. His behavior is normal.    BP (!) 127/58   Pulse (!) 56   Ht 5\' 6"  (1.676 m)   Wt 157 lb (71.2 kg)   SpO2 100%   BMI 25.34 kg/m  Wt Readings from Last 3 Encounters:  03/31/19 157 lb (71.2 kg)  11/26/18 160 lb (72.6 kg)  09/26/18 161 lb (73 kg)     There are no preventive care reminders to display for this patient.  There are no preventive care reminders to display for this patient.  No results found for: TSH Lab Results  Component Value Date   WBC 5.0 09/04/2017   HGB 14.6 09/04/2017   HCT 42.5 09/04/2017   MCV 95.1 09/04/2017   PLT 186 09/04/2017   Lab Results  Component Value Date   NA 139 09/26/2018   K 4.3 09/26/2018   CO2 27 09/26/2018   GLUCOSE 101 (H) 09/26/2018   BUN 17 09/26/2018   CREATININE 0.85 09/26/2018   BILITOT 0.7 09/26/2018   ALKPHOS 51 04/12/2016   AST 40 (H) 09/26/2018   ALT 17 09/26/2018   PROT 6.8 09/26/2018   ALBUMIN 4.3 04/12/2016   CALCIUM 9.5 09/26/2018   Lab Results  Component Value Date   CHOL 128 03/21/2018   Lab Results  Component Value Date   HDL 43 03/21/2018   Lab Results  Component Value Date   LDLCALC 71 03/21/2018   Lab Results  Component Value Date   TRIG 61 03/21/2018   Lab Results  Component Value Date   CHOLHDL 3.0 03/21/2018   Lab Results  Component Value Date   HGBA1C 4.7 03/31/2019      Assessment & Plan:   Problem List Items Addressed This Visit      Cardiovascular and Mediastinum   HYPERTENSION, BENIGN ESSENTIAL - Primary    Well controlled. Continue current regimen.  Follow up in  6 mo      Relevant Orders   POCT HgB A1C (Completed)   Lipid Panel w/reflex Direct LDL   COMPLETE METABOLIC PANEL WITH GFR     Endocrine   IFG (impaired fasting glucose)    Well controlled. Continue current regimen. Follow up in  12 mo      Relevant Orders   POCT HgB  A1C (Completed)   Lipid Panel w/reflex Direct LDL   COMPLETE METABOLIC PANEL WITH GFR     Other   ELEVATED PROSTATE SPECIFIC ANTIGEN   Relevant Orders   PSA    Other Visit Diagnoses    Seborrheic keratosis          No orders of the defined types were placed in this encounter.   Follow-up: Return in about 6 months (around 09/29/2019) for Hypertension.   Cryotherapy Procedure Note  Pre-operative Diagnosis: seb keratosis vs actinic keratosis Post-operative Diagnosis: same  Locations: top of scalp  Indications: not resolving   Anesthesia: not required    Procedure Details  Patient informed of risks (permanent scarring, infection, light or dark discoloration, bleeding, infection, weakness, numbness and recurrence of the lesion) and benefits of the procedure and verbal informed consent obtained.  The areas are treated with liquid nitrogen therapy, frozen until ice ball extended 1-2 mm beyond lesion, allowed to thaw, and treated again. The patient tolerated procedure well.  The patient was instructed on post-op care, warned that there may be blister formation, redness and pain. Recommend OTC analgesia as needed for pain.  Condition: Stable  Complications: none.  Plan: 1. Instructed to keep the area dry and covered for 24-48h and clean thereafter. 2. Warning signs of infection were reviewed.   3. Recommended that the patient use OTC acetaminophen as needed for pain.  4. Return PRN. If lesion returns may need biopsy.     Beatrice Lecher, MD

## 2019-03-31 NOTE — Assessment & Plan Note (Signed)
Well controlled. Continue current regimen. Follow up in  6 mo  

## 2019-03-31 NOTE — Patient Instructions (Signed)
OK to cut the lisinopril in half. Monitoro BP twice a week.

## 2019-03-31 NOTE — Assessment & Plan Note (Signed)
Well controlled. Continue current regimen. Follow up in  12 mo  

## 2019-04-01 LAB — COMPLETE METABOLIC PANEL WITH GFR
AG Ratio: 1.6 (calc) (ref 1.0–2.5)
ALT: 19 U/L (ref 9–46)
AST: 46 U/L — ABNORMAL HIGH (ref 10–35)
Albumin: 4.4 g/dL (ref 3.6–5.1)
Alkaline phosphatase (APISO): 75 U/L (ref 35–144)
BUN: 16 mg/dL (ref 7–25)
CO2: 25 mmol/L (ref 20–32)
Calcium: 9.4 mg/dL (ref 8.6–10.3)
Chloride: 107 mmol/L (ref 98–110)
Creat: 0.88 mg/dL (ref 0.70–1.18)
GFR, Est African American: 98 mL/min/{1.73_m2} (ref >=60)
GFR, Est Non African American: 85 mL/min/{1.73_m2} (ref >=60)
Globulin: 2.7 g/dL (calc) (ref 1.9–3.7)
Glucose, Bld: 99 mg/dL (ref 65–99)
Potassium: 4.2 mmol/L (ref 3.5–5.3)
Sodium: 142 mmol/L (ref 135–146)
Total Bilirubin: 0.8 mg/dL (ref 0.2–1.2)
Total Protein: 7.1 g/dL (ref 6.1–8.1)

## 2019-04-01 LAB — LIPID PANEL W/REFLEX DIRECT LDL
Cholesterol: 120 mg/dL (ref <200)
HDL: 47 mg/dL (ref >=40)
LDL Cholesterol (Calc): 59 mg/dL (calc)
Non-HDL Cholesterol (Calc): 73 mg/dL (calc) (ref <130)
Total CHOL/HDL Ratio: 2.6 (calc) (ref <5.0)
Triglycerides: 62 mg/dL (ref <150)

## 2019-04-01 LAB — PSA: PSA: 1.6 ng/mL (ref <=4.0)

## 2019-04-28 ENCOUNTER — Other Ambulatory Visit: Payer: Self-pay

## 2019-04-28 DIAGNOSIS — I10 Essential (primary) hypertension: Secondary | ICD-10-CM

## 2019-04-28 MED ORDER — ATORVASTATIN CALCIUM 40 MG PO TABS: 40.0000 mg | ORAL_TABLET | Freq: Every day | ORAL | 3 refills | Status: DC

## 2019-04-28 MED ORDER — LISINOPRIL 10 MG PO TABS: 10.0000 mg | ORAL_TABLET | Freq: Every day | ORAL | 3 refills | Status: DC

## 2019-06-02 ENCOUNTER — Telehealth: Payer: Self-pay

## 2019-06-02 MED ORDER — AZELASTINE HCL 0.1 % NA SOLN: 2.0000 | Freq: Two times a day (BID) | NASAL | 6 refills | Status: DC

## 2019-06-02 NOTE — Telephone Encounter (Signed)
Patient advised.

## 2019-06-02 NOTE — Telephone Encounter (Signed)
Okay, prescription sent for Astelin.  Works differently than the [employer].  Prescription sent to [facility].

## 2019-06-02 NOTE — Telephone Encounter (Signed)
Brad Cannon called and left a message stating he has spoke with Dr Madilyn Fireman about having a lot of sinus drainage after swimming. He states he is using Flonase daily. He wanted to know if there is anything he can take to dry out his sinuses. Please advise.

## 2019-08-19 DIAGNOSIS — H43813 Vitreous degeneration, bilateral: Secondary | ICD-10-CM | POA: Diagnosis not present

## 2019-08-19 DIAGNOSIS — H524 Presbyopia: Secondary | ICD-10-CM | POA: Diagnosis not present

## 2019-08-19 DIAGNOSIS — H2513 Age-related nuclear cataract, bilateral: Secondary | ICD-10-CM | POA: Diagnosis not present

## 2019-08-19 DIAGNOSIS — Z0100 Encounter for examination of eyes and vision without abnormal findings: Secondary | ICD-10-CM | POA: Diagnosis not present

## 2019-09-22 ENCOUNTER — Encounter: Payer: Self-pay | Admitting: Family Medicine

## 2019-09-22 ENCOUNTER — Ambulatory Visit (INDEPENDENT_AMBULATORY_CARE_PROVIDER_SITE_OTHER): Payer: Medicare HMO | Admitting: Family Medicine

## 2019-09-22 VITALS — BP 120/65 | HR 62 | Ht 66.0 in | Wt 154.0 lb

## 2019-09-22 DIAGNOSIS — R234 Changes in skin texture: Secondary | ICD-10-CM

## 2019-09-22 DIAGNOSIS — I77811 Abdominal aortic ectasia: Secondary | ICD-10-CM | POA: Diagnosis not present

## 2019-09-22 DIAGNOSIS — R7401 Elevation of levels of liver transaminase levels: Secondary | ICD-10-CM

## 2019-09-22 DIAGNOSIS — I10 Essential (primary) hypertension: Secondary | ICD-10-CM | POA: Diagnosis not present

## 2019-09-22 DIAGNOSIS — R7301 Impaired fasting glucose: Secondary | ICD-10-CM

## 2019-09-22 DIAGNOSIS — E785 Hyperlipidemia, unspecified: Secondary | ICD-10-CM | POA: Diagnosis not present

## 2019-09-22 LAB — POCT GLYCOSYLATED HEMOGLOBIN (HGB A1C): Hemoglobin A1C: 5.4 % (ref 4.0–5.6)

## 2019-09-22 NOTE — Assessment & Plan Note (Signed)
Measures 2.9 cm on ultrasound performed July 2020.  Recommendation is for repeat in 5 years which would be July 2025. 

## 2019-09-22 NOTE — Assessment & Plan Note (Signed)
A1c today looks great at 5.4.  Plan to recheck again in 1 year.

## 2019-09-22 NOTE — Assessment & Plan Note (Addendum)
Well controlled.  Still think it may be worth trying to decrease his lisinopril think he would actually tolerate it well and blood pressures would still be optimal.  Offered to send over the 5 mg prescription but he says he will try to find his pill splitter or get a new one.  Continue current regimen. Follow up in  6 mo

## 2019-09-22 NOTE — Progress Notes (Signed)
Established Patient Office Visit  Subjective:  Patient ID: Brad Cannon, male    DOB: 15-Jun-1945  Age: 74 y.o. MRN: [id number]  CC:  Chief Complaint  Patient presents with  . Hypertension  . ifg    HPI Brad Cannon presents for   Hypertension- Pt denies chest pain, SOB, dizziness, or heart palpitations.  Taking meds as directed w/o problems.  Denies medication side effects.  He said he never decreased his lisinopril down to 5 mg, from 10 mg he said he never found the pill splitter.  Impaired fasting glucose-no increased thirst or urination. No symptoms consistent with hypoglycemia.  He is doing well overall he denies any recent chest pain or heart flutters.  He still swimming and walking pretty regularly.  He did want me to look at his left heel today he has been getting some skin cracks occasionally he has recently started using a cream that his wife was using and says it actually has been helpful.   Past Medical History:  Diagnosis Date  . AAA (abdominal aortic aneurysm) ([place])   . Hepatitis A 1998   with elevated LFT's  . Hyperlipidemia   . Hypertension   . IFG (impaired fasting glucose)     Past Surgical History:  Procedure Laterality Date  . arthroscopic surgery  1982   left knee    Family History  Problem Relation Age of Onset  . Hypertension Mother   . Hyperlipidemia Mother   . Heart disease Father   . Hyperlipidemia Father   . Hypertension Father   . Hypertension Sister   . Hyperlipidemia Sister   . Hyperlipidemia Brother   . Hypertension Brother   . Heart disease Brother     Social History   Socioeconomic History  . Marital status: Married    Spouse name: Santiago Glad  . Number of children: 2  . Years of education: 76  . Highest education level: Master's degree (e.g., MA, MS, MEng, MEd, MSW, MBA)  Occupational History  . 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 Smoking status: Never Smoker  . Smokeless tobacco:  Never Used  Vaping Use  . Vaping Use: Never used  Substance and Sexual Activity  . Alcohol use: Not on file  . Drug use: No  . Sexual activity: Not Currently  Other Topics Concern  . Not on file  Social History Narrative   Works out regularly.  No caffeine.     Social Determinants of Health   Financial Resource Strain: Low Risk   . Difficulty of Paying Living Expenses: Not hard at all  Food Insecurity: No Food Insecurity  . Worried About Charity fundraiser in the Last Year: Never true  . Ran Out of Food in the Last Year: Never true  Transportation Needs: No Transportation Needs  . Lack of Transportation (Medical): No  . Lack of Transportation (Non-Medical): No  Physical Activity: Sufficiently Active  . Days of Exercise per Week: 6 days  . Minutes of Exercise per Session: 120 min  Stress: No Stress Concern Present  . Feeling of Stress : Not at all  Social Connections: Moderately Isolated  . Frequency of Communication with Friends and Family: Once a week  . Frequency of Social Gatherings with Friends and Family: Three times a week  . Attends Religious Services: Never  . Active Member of Clubs or Organizations: No  . Attends Archivist Meetings: Never  . Marital Status: Married  Intimate Partner Violence: Not At Risk  . Fear of Current or Ex-Partner: No  . Emotionally Abused: No  . Physically Abused: No  . Sexually Abused: No    Outpatient Medications Prior to Visit  Medication Sig Dispense Refill  . atorvastatin (LIPITOR) 40 MG tablet Take 1 tablet (40 mg total) by mouth daily. 90 tablet 3  . lisinopril (ZESTRIL) 10 MG tablet Take 1 tablet (10 mg total) by mouth daily. 90 tablet 3  . azelastine (ASTELIN) 0.1 % nasal spray Place 2 sprays into both nostrils 2 (two) times daily. Use in each nostril as directed 30 mL 6  . fluticasone (FLONASE) 50 MCG/ACT nasal spray Place 2 sprays into both nostrils daily. 16 g 2   No facility-administered medications prior to  visit.    No Known Allergies  ROS Review of Systems    Objective:    Physical Exam Constitutional:      Appearance: He is well-developed.  HENT:     Head: Normocephalic and atraumatic.  Cardiovascular:     Rate and Rhythm: Normal rate and regular rhythm.     Heart sounds: Normal heart sounds.  Pulmonary:     Effort: Pulmonary effort is normal.     Breath sounds: Normal breath sounds.  Skin:    General: Skin is warm and dry.     Comments: Crack on his heel with a scab in center.  No induration or surrounding erythema.  No active drainage or bleeding.  Neurological:     Mental Status: He is alert and oriented to person, place, and time.  Psychiatric:        Behavior: Behavior normal.     BP 120/65   Pulse 62   Ht 5\' 6"  (1.676 m)   Wt 154 lb (69.9 kg)   SpO2 99%   BMI 24.86 kg/m  Wt Readings from Last 3 Encounters:  09/22/19 154 lb (69.9 kg)  03/31/19 157 lb (71.2 kg)  11/26/18 160 lb (72.6 kg)     There are no preventive care reminders to display for this patient.  There are no preventive care reminders to display for this patient.  No results found for: TSH Lab Results  Component Value Date   WBC 5.0 09/04/2017   HGB 14.6 09/04/2017   HCT 42.5 09/04/2017   MCV 95.1 09/04/2017   PLT 186 09/04/2017   Lab Results  Component Value Date   NA 142 03/31/2019   K 4.2 03/31/2019   CO2 25 03/31/2019   GLUCOSE 99 03/31/2019   BUN 16 03/31/2019   CREATININE 0.88 03/31/2019   BILITOT 0.8 03/31/2019   ALKPHOS 51 04/12/2016   AST 46 (H) 03/31/2019   ALT 19 03/31/2019   PROT 7.1 03/31/2019   ALBUMIN 4.3 04/12/2016   CALCIUM 9.4 03/31/2019   Lab Results  Component Value Date   CHOL 120 03/31/2019   Lab Results  Component Value Date   HDL 47 03/31/2019   Lab Results  Component Value Date   LDLCALC 59 03/31/2019   Lab Results  Component Value Date   TRIG 62 03/31/2019   Lab Results  Component Value Date   CHOLHDL 2.6 03/31/2019   Lab Results   Component Value Date   HGBA1C 5.4 09/22/2019      Assessment & Plan:   Problem List Items Addressed This Visit      Cardiovascular and Mediastinum   HYPERTENSION, BENIGN ESSENTIAL - Primary    Well controlled.  Still think it may  be worth trying to decrease his lisinopril think he would actually tolerate it well and blood pressures would still be optimal.  Offered to send over the 5 mg prescription but he says he will try to find his pill splitter or get a new one.  Continue current regimen. 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  Recommendation is for repeat in 5 years which would be July 2025.        Endocrine   IFG (impaired fasting glucose)    A1c today looks great at 5.4.  Plan to recheck again in 1 year.      Relevant Orders   POCT glycosylated hemoglobin (Hb A1C) (Completed)     Other   Hyperlipemia    Lipids are well controlled.        Elevated AST (SGOT)   Relevant Orders   Hepatic function panel    Other Visit Diagnoses    Cracked skin on feet          Cracked skin of feet-just encouraged him to moisturize well with a good cream instead of a lotion which can be more watery.  Being that he does swim a lot and is in the chlorine allotments likely causing some excess dryness.  Crack has a scab over it but it does not look infected gave him reassurance.  Elevated AST-had a discussion today about what causes mild elevations in liver enzymes.  He does drink some alcohol.  Plan to recheck AST.  No orders of the defined types were placed in this encounter.   Follow-up: Return in about 6 months (around 03/23/2020) for Diabetes follow-up.    Beatrice Lecher, MD

## 2019-09-22 NOTE — Assessment & Plan Note (Signed)
Lipids are well controlled. ?

## 2019-09-23 LAB — HEPATIC FUNCTION PANEL
AG Ratio: 1.7 (calc) (ref 1.0–2.5)
ALT: 17 U/L (ref 9–46)
AST: 45 U/L — ABNORMAL HIGH (ref 10–35)
Albumin: 4.2 g/dL (ref 3.6–5.1)
Alkaline phosphatase (APISO): 99 U/L (ref 35–144)
Bilirubin, Direct: 0.1 mg/dL (ref 0.0–0.2)
Globulin: 2.5 g/dL (calc) (ref 1.9–3.7)
Indirect Bilirubin: 0.4 mg/dL (calc) (ref 0.2–1.2)
Total Bilirubin: 0.5 mg/dL (ref 0.2–1.2)
Total Protein: 6.7 g/dL (ref 6.1–8.1)

## 2019-09-29 ENCOUNTER — Ambulatory Visit: Payer: Medicare HMO | Admitting: Family Medicine

## 2019-11-27 ENCOUNTER — Ambulatory Visit: Payer: Medicare HMO

## 2019-12-22 ENCOUNTER — Encounter: Payer: Self-pay | Admitting: Nurse Practitioner

## 2019-12-22 ENCOUNTER — Ambulatory Visit (INDEPENDENT_AMBULATORY_CARE_PROVIDER_SITE_OTHER): Payer: Medicare HMO | Admitting: Nurse Practitioner

## 2019-12-22 ENCOUNTER — Encounter: Payer: Medicare HMO | Admitting: Family Medicine

## 2019-12-22 DIAGNOSIS — Z0000 Encounter for general adult medical examination without abnormal findings: Secondary | ICD-10-CM

## 2019-12-22 NOTE — Progress Notes (Signed)
Subjective:   Brad Cannon is a 74 y.o. male who presents for Medicare Annual/Subsequent preventive examination.  The patient consented to a virtual visit. Patient consented to have virtual visit and was identified by name and date of birth. Method of visit: Telephone Encounter participants: Patient: Brad Cannon - located at home Nurse/Provider: Orma Render - located in the office Others (if applicable): N/A   Review of Systems:  Denies chest pain, shortness of breath, or palpitations Denies changes in bowel or bladder habits Denies any new headaches or vision changes Denies weakness or difficulty moving around independently       Objective:    Vitals: There were no vitals taken for this visit.  There is no height or weight on file to calculate BMI.  Advanced Directives 12/22/2019 11/26/2018 09/08/2014  Does Patient Have a Medical Advance Directive? No No No  Would patient like information on creating a medical advance directive? Yes (MAU/Ambulatory/Procedural Areas - Information given) Yes (MAU/Ambulatory/Procedural Areas - Information given) Yes - Educational materials given    Tobacco Social History   Tobacco Use  Smoking Status Never Smoker  Smokeless Tobacco Never Used     Counseling given: Not Answered   Clinical Intake:  Pre-visit preparation completed: Yes  Pain : No/denies pain     Diabetes: No  How often do you need to have someone help you when you read instructions, pamphlets, or other written materials from your doctor or pharmacy?: 1 - Never What is the last grade level you completed in school?: Bachelors degree  Interpreter Needed?: No  Information entered by :: [place],CMA  Past Medical History:  Diagnosis Date   AAA (abdominal aortic aneurysm) ([place])    Hepatitis A 1998   with elevated LFT's   Hyperlipidemia    Hypertension    IFG (impaired fasting glucose)    Past Surgical History:  Procedure Laterality Date    arthroscopic surgery  1982   left knee   Family History  Problem Relation Age of Onset   Hypertension Mother    Hyperlipidemia Mother    Heart disease Father    Hyperlipidemia Father    Hypertension Father    Hypertension Sister    Hyperlipidemia Sister    Hyperlipidemia Brother    Hypertension Brother    Heart disease Brother    Social History   Socioeconomic History   Marital status: Married    Spouse name: Santiago Glad   Number of children: 2   Years of education: 18   Highest education level: Master's degree (e.g., MA, MS, MEng, MEd, MSW, MBA)  Occupational History   Occupation: Teacher, 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  No caffeine.     Social Determinants of Health   Financial Resource Strain: Low Risk    Difficulty of Paying Living Expenses: Not hard at all  Food Insecurity: No Food Insecurity   Worried About Charity fundraiser in the Last Year: Never true   [place] in the  Last Year: Never true  Transportation Needs: No Transportation Needs   Lack of Transportation (Medical): No   Lack of Transportation (Non-Medical): No  Physical Activity: Sufficiently Active   Days of Exercise per Week: 7 days   Minutes of Exercise per Session: 90 min  Stress: No Stress Concern Present   Feeling of Stress : Not at all  Social Connections: Moderately Isolated   Frequency of Communication with Friends and Family: More than three times a week   Frequency of Social Gatherings with Friends and Family: More than three times a week   Attends Religious Services: Never    Marine scientist or Organizations: No   Attends Archivist Meetings: Never   Marital Status: Married    Outpatient Encounter Medications as of 12/22/2019  Medication Sig   atorvastatin (LIPITOR) 40 MG tablet Take 1 tablet (40 mg total) by mouth daily.   lisinopril (ZESTRIL) 10 MG tablet Take 1 tablet (10 mg total) by mouth daily.   No facility-administered encounter medications on file as of 12/22/2019.    Activities of Daily Living In your present state of health, do you have any difficulty performing the following activities: 12/22/2019  Hearing? N  Vision? N  Difficulty concentrating or making decisions? N  Walking or climbing stairs? N  Dressing or bathing? N  Doing errands, shopping? N  Preparing Food and eating ? N  Using the Toilet? N  In the past six months, have you accidently leaked urine? N  Do you have problems with loss of bowel control? N  Managing your Medications? N  Managing your Finances? N  Housekeeping or managing your Housekeeping? N  Some recent data might be hidden    Patient Care Team: Hali Marry, MD as PCP - General (Family Medicine)   Assessment:   This is a routine wellness examination for Brad Cannon.  Exercise Activities and Dietary recommendations Current Exercise Habits: Home exercise routine, Type of exercise: walking;Other - see comments (swimming), Time (Minutes): > 60, Frequency (Times/Week): 7, Weekly Exercise (Minutes/Week): 0, Intensity: Moderate  Goals      Patient Stated      Patient stated wants to try and drink less alcohol for the rest of the year      Patient Stated (pt-stated)      I would like to get more involved in model ship building.        Fall Risk Fall Risk  12/22/2019 03/31/2019 11/26/2018 03/21/2018 09/04/2017  Falls in the past year? 0 0 0 0 No  Number falls in past yr: 0 0 - 0 -  Injury with Fall? 0 0 - - -  Follow up Falls evaluation completed - Falls prevention discussed - -   Is  the patient's home free of loose throw rugs in walkways, pet beds, electrical cords, etc?   no      Grab bars in the bathroom? yes      Handrails on the stairs?   yes      Adequate lighting?   yes  Timed Get Up and Go Performed: NA Patient rating of health (0-10): 10  Depression Screen PHQ 2/9 Scores 12/22/2019 03/31/2019 11/26/2018 03/21/2018  PHQ - 2 Score 0 0 0 0    Cognitive Function     6CIT Screen 12/22/2019 11/26/2018  What Year? - 0 points  What month? - 0 points  What time? 0 points 0 points  Count back from 20 0 points 0 points  Months in  reverse 0 points 0 points  Repeat phrase 0 points 0 points  Total Score - 0    Immunization History  Administered Date(s) Administered   Fluad Quad(high Dose 65+) 12/17/2019   Influenza Split 01/01/2012   Influenza Whole 02/21/2006   Influenza, High Dose Seasonal PF 01/23/2017, 01/22/2018, 01/15/2019   Influenza,inj,Quad PF,6+ Mos 12/25/2013, 01/22/2018   Influenza-Unspecified 03/19/45, 01/15/2013, 01/12/2015, 02/03/2016   Moderna SARS-COVID-2 Vaccination 06/02/2019, 06/30/2019   Pneumococcal Conjugate-13 02/18/2014   Pneumococcal Polysaccharide-23 11/28/2011   Td 02/21/2006   Tdap 04/18/2016   Zoster 10/09/2008    Screening Tests Health Maintenance  Topic Date Due   TETANUS/TDAP  04/18/2026   COLONOSCOPY  04/20/2027   INFLUENZA VACCINE  Completed   COVID-19 Vaccine  Completed   Hepatitis C Screening  Completed   PNA vac Low Risk Adult  Completed   Cancer Screenings: Lung: Low Dose CT Chest recommended if Age 41-80 years, 30 pack-year currently smoking OR have quit w/in 15years. Patient does not qualify. Colorectal: 04/19/2017  Additional Screenings: N/A       Plan:   You spoke to Orma Render, NP over the phone for your annual wellness visit.  We discussed goals: Goals      Patient Stated      Patient stated wants to try and drink less alcohol for the rest of the year      Patient  Stated (pt-stated)      I would like to get more involved in model ship building.        We also discussed recommended health maintenance. Please call our office and schedule a visit. As discussed, you are due for: Health Maintenance  Topic Date Due   TETANUS/TDAP  04/18/2026   COLONOSCOPY  04/20/2027   INFLUENZA VACCINE  Completed   COVID-19 Vaccine  Completed   Hepatitis C Screening  Completed   PNA vac Low Risk Adult  Completed    We also discussed continuing with the daily activities and exercise regimen.     Our clinic's number is [phone]. Please call with questions or concerns about what we discussed today.   I have personally reviewed and noted the following in the patients chart:    Medical and social history  Use of alcohol, tobacco or illicit drugs   Current medications and supplements  Functional ability and status  Nutritional status  Physical activity  Advanced directives  List of other physicians  Hospitalizations, surgeries, and ER visits in previous 12 months  Vitals  Screenings to include cognitive, depression, and falls  Referrals and appointments  In addition, I have reviewed and discussed with patient certain preventive protocols, quality metrics, and best practice recommendations. A written personalized care plan for preventive services as well as general preventive health recommendations were provided to patient.    This visit was conducted virtually in the setting of the [place] pandemic.    Orma Render, NP  12/22/2019

## 2019-12-22 NOTE — Patient Instructions (Signed)
You spoke to Orma Render, NP over the phone for your annual wellness visit.  We discussed goals: Goals    .  Patient Stated      Patient stated wants to try and drink less alcohol for the rest of the year    .  Patient Stated (pt-stated)      I would like to get more involved in model ship building.        We also discussed recommended health maintenance. Please call our office and schedule a visit. As discussed, you are due for: Health Maintenance  Topic Date Due  . TETANUS/TDAP  04/18/2026  . COLONOSCOPY  04/20/2027  . INFLUENZA VACCINE  Completed  . COVID-19 Vaccine  Completed  . Hepatitis C Screening  Completed  . PNA vac Low Risk Adult  Completed      We also discussed continuing the great work with regular exercise and physical activity.    Our clinic's number is [phone]. Please call with questions or concerns about what we discussed today.

## 2020-02-02 ENCOUNTER — Ambulatory Visit (INDEPENDENT_AMBULATORY_CARE_PROVIDER_SITE_OTHER): Payer: Medicare HMO | Admitting: Family Medicine

## 2020-02-02 ENCOUNTER — Encounter: Payer: Self-pay | Admitting: Family Medicine

## 2020-02-02 VITALS — BP 114/69 | HR 58 | Ht 66.0 in | Wt 157.0 lb

## 2020-02-02 DIAGNOSIS — J310 Chronic rhinitis: Secondary | ICD-10-CM | POA: Diagnosis not present

## 2020-02-02 DIAGNOSIS — H60332 Swimmer's ear, left ear: Secondary | ICD-10-CM | POA: Diagnosis not present

## 2020-02-02 DIAGNOSIS — I10 Essential (primary) hypertension: Secondary | ICD-10-CM

## 2020-02-02 MED ORDER — IPRATROPIUM BROMIDE 0.06 % NA SOLN: 1.0000 | Freq: Two times a day (BID) | NASAL | 3 refills | Status: DC | PRN

## 2020-02-02 MED ORDER — OFLOXACIN 0.3 % OT SOLN: 10.0000 [drp] | Freq: Two times a day (BID) | OTIC | 0 refills | Status: AC

## 2020-02-02 NOTE — Assessment & Plan Note (Signed)
Also encouraged him to go ahead and split his lisinopril in half for the next 2 weeks that we when he comes back in 2 weeks we can see if his blood pressure still looks well controlled.  Is a little on the low end today.

## 2020-02-02 NOTE — Progress Notes (Signed)
Established Patient Office Visit  Subjective:  Patient ID: Brad Cannon, male    DOB: 1945-11-10  Age: 74 y.o. MRN: [id number]  CC:  Chief Complaint  Patient presents with  . Ear Fullness    HPI Brad Cannon presents for   A couple of concerns.  He says he and his wife went out of town and when he came back he started ramping up his swimming.  He noticed that he was starting to have a little bit of fullness in his ears.  Then he went to get his third [place] on Friday and felt completely congested with a lot of headache and pressure in both ears felt very clogged for about a day and a half.  He says the congestion and headache actually have seem to have improved significantly but still feels like his left ear in particular is "foggy".  He also struggles with a lot of sinus drainage after he swims and wants to know if in general or something that we can do about this.  This has been going on for some time and is not new.  Past Medical History:  Diagnosis Date  . AAA (abdominal aortic aneurysm) ([place])   . Hepatitis A 1998   with elevated LFT's  . Hyperlipidemia   . Hypertension   . IFG (impaired fasting glucose)     Past Surgical History:  Procedure Laterality Date  . arthroscopic surgery  1982   left knee    Family History  Problem Relation Age of Onset  . Hypertension Mother   . Hyperlipidemia Mother   . Heart disease Father   . Hyperlipidemia Father   . Hypertension Father   . Hypertension Sister   . Hyperlipidemia Sister   . Hyperlipidemia Brother   . Hypertension Brother   . Heart disease Brother     Social History   Socioeconomic History  . Marital status: Married    Spouse name: Santiago Glad  . Number of children: 2  . Years of education: 29  . Highest education level: Master's degree (e.g., MA, MS, MEng, MEd, MSW, MBA)  Occupational History  . 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 Smoking status: Never  Smoker  . Smokeless tobacco: Never Used  Vaping Use  . Vaping Use: Never used  Substance and Sexual Activity  . Alcohol use: Yes    Alcohol/week: 2.0 - 3.0 standard drinks    Types: 2 - 3 Cans of beer per week  . Drug use: No  . Sexual activity: Not Currently  Other Topics Concern  . Not on file  Social History Narrative   Works out regularly.  No caffeine.     Social Determinants of Health   Financial Resource Strain: Low Risk   . Difficulty of Paying Living Expenses: Not hard at all  Food Insecurity: No Food Insecurity  . Worried About Charity fundraiser in the Last Year: Never true  . Ran Out of Food in the Last Year: Never true  Transportation Needs: No Transportation Needs  . Lack of Transportation (Medical): No  . Lack of Transportation (Non-Medical): No  Physical Activity: Sufficiently Active  . Days of Exercise per Week: 7 days  . Minutes of Exercise per Session: 90 min  Stress: No Stress Concern Present  . Feeling of Stress : Not at all  Social Connections: Moderately Isolated  . Frequency of Communication with Friends and Family: More than three  times a week  . Frequency of Social Gatherings with Friends and Family: More than three times a week  . Attends Religious Services: Never  . Active Member of Clubs or Organizations: No  . Attends Archivist Meetings: Never  . Marital Status: Married  Human resources officer Violence: Not At Risk  . Fear of Current or Ex-Partner: No  . Emotionally Abused: No  . Physically Abused: No  . Sexually Abused: No    Outpatient Medications Prior to Visit  Medication Sig Dispense Refill  . atorvastatin (LIPITOR) 40 MG tablet Take 1 tablet (40 mg total) by mouth daily. 90 tablet 3  . lisinopril (ZESTRIL) 10 MG tablet Take 1 tablet (10 mg total) by mouth daily. 90 tablet 3   No facility-administered medications prior to visit.    No Known Allergies  ROS Review of Systems    Objective:    Physical  Exam Constitutional:      Appearance: He is well-developed.  HENT:     Head: Normocephalic and atraumatic.     Right Ear: Tympanic membrane, ear canal and external ear normal.     Left Ear: Tympanic membrane and external ear normal.     Ears:     Comments: Left TM and canal have a white/yellow discharge.  Unable to visualize the structure of the tympanic membrane well.    Nose: Nose normal.     Mouth/Throat:     Pharynx: No posterior oropharyngeal erythema.  Eyes:     Conjunctiva/sclera: Conjunctivae normal.     Pupils: Pupils are equal, round, and reactive to light.  Neck:     Thyroid: No thyromegaly.  Cardiovascular:     Rate and Rhythm: Normal rate and regular rhythm.     Heart sounds: Normal heart sounds.  Pulmonary:     Effort: Pulmonary effort is normal.     Breath sounds: Normal breath sounds.  Musculoskeletal:     Cervical back: Neck supple.  Lymphadenopathy:     Cervical: No cervical adenopathy.  Skin:    General: Skin is warm and dry.  Neurological:     Mental Status: He is alert and oriented to person, place, and time.  Psychiatric:        Mood and Affect: Mood normal.        Behavior: Behavior normal.     BP 114/69   Pulse (!) 58   Ht 5\' 6"  (1.676 m)   Wt 157 lb (71.2 kg)   SpO2 100%   BMI 25.34 kg/m  Wt Readings from Last 3 Encounters:  02/02/20 157 lb (71.2 kg)  09/22/19 154 lb (69.9 kg)  03/31/19 157 lb (71.2 kg)     There are no preventive care reminders to display for this patient.  There are no preventive care reminders to display for this patient.  No results found for: TSH Lab Results  Component Value Date   WBC 5.0 09/04/2017   HGB 14.6 09/04/2017   HCT 42.5 09/04/2017   MCV 95.1 09/04/2017   PLT 186 09/04/2017   Lab Results  Component Value Date   NA 142 03/31/2019   K 4.2 03/31/2019   CO2 25 03/31/2019   GLUCOSE 99 03/31/2019   BUN 16 03/31/2019   CREATININE 0.88 03/31/2019   BILITOT 0.5 09/22/2019   ALKPHOS 51 04/12/2016    AST 45 (H) 09/22/2019   ALT 17 09/22/2019   PROT 6.7 09/22/2019   ALBUMIN 4.3 04/12/2016   CALCIUM 9.4 03/31/2019   Lab  Results  Component Value Date   CHOL 120 03/31/2019   Lab Results  Component Value Date   HDL 47 03/31/2019   Lab Results  Component Value Date   LDLCALC 59 03/31/2019   Lab Results  Component Value Date   TRIG 62 03/31/2019   Lab Results  Component Value Date   CHOLHDL 2.6 03/31/2019   Lab Results  Component Value Date   HGBA1C 5.4 09/22/2019      Assessment & Plan:   Problem List Items Addressed This Visit      Cardiovascular and Mediastinum   HYPERTENSION, BENIGN ESSENTIAL    Also encouraged him to go ahead and split his lisinopril in half for the next 2 weeks that we when he comes back in 2 weeks we can see if his blood pressure still looks well controlled.  Is a little on the low end today.       Other Visit Diagnoses    Acute swimmer's ear of left side    -  Primary   Relevant Medications   ofloxacin (FLOXIN) 0.3 % OTIC solution   Rhinitis, unspecified type       Relevant Medications   ipratropium (ATROVENT) 0.06 % nasal spray      Acute left swimmer's ear-we will treat with ofloxacin eardrops.  I will have him come back in 2 weeks to make sure the infection is cleared.  Okay to swim as long as he uses earplugs and he cleans the plugs well in between uses.  Avoid putting anything into the ear.  Rhinitis-we will treat with Atrovent nasal spray.  Explained that this can help but would not completely resolve his symptoms.  Meds ordered this encounter  Medications  . ipratropium (ATROVENT) 0.06 % nasal spray    Sig: Place 1-2 sprays into both nostrils 2 (two) times daily as needed for rhinitis.    Dispense:  15 mL    Refill:  3  . ofloxacin (FLOXIN) 0.3 % OTIC solution    Sig: Place 10 drops into the left ear 2 (two) times daily for 7 days.    Dispense:  5 mL    Refill:  0    Follow-up: Return in about 2 weeks (around  02/16/2020) for recheck left ear and BP .    Beatrice Lecher, MD

## 2020-02-16 ENCOUNTER — Encounter: Payer: Self-pay | Admitting: Family Medicine

## 2020-02-16 ENCOUNTER — Other Ambulatory Visit: Payer: Self-pay

## 2020-02-16 ENCOUNTER — Ambulatory Visit (INDEPENDENT_AMBULATORY_CARE_PROVIDER_SITE_OTHER): Payer: Medicare HMO | Admitting: Family Medicine

## 2020-02-16 VITALS — BP 125/67 | HR 66 | Ht 66.0 in | Wt 157.0 lb

## 2020-02-16 DIAGNOSIS — L299 Pruritus, unspecified: Secondary | ICD-10-CM | POA: Diagnosis not present

## 2020-02-16 DIAGNOSIS — I10 Essential (primary) hypertension: Secondary | ICD-10-CM

## 2020-02-16 DIAGNOSIS — H60332 Swimmer's ear, left ear: Secondary | ICD-10-CM | POA: Diagnosis not present

## 2020-02-16 MED ORDER — LISINOPRIL 5 MG PO TABS: 5.0000 mg | ORAL_TABLET | Freq: Every day | ORAL | 0 refills | Status: DC

## 2020-02-16 MED ORDER — AMOXICILLIN-POT CLAVULANATE 875-125 MG PO TABS: 1.0000 | ORAL_TABLET | Freq: Two times a day (BID) | ORAL | 0 refills | Status: DC

## 2020-02-16 NOTE — Assessment & Plan Note (Signed)
BP looks great on 5 mg of lisinopril.

## 2020-02-16 NOTE — Progress Notes (Addendum)
Acute Office Visit  Subjective:    Patient ID: Brad Cannon, male    DOB: 26-Oct-1945, 74 y.o.   MRN: [id number]  Chief Complaint  Patient presents with  . Hypertension    HPI Patient is in today for recheck right ear.  Did complete the antibiotic drops and completed those last Monday.  He says it is better but still getting some intermittent fullness and plugging and popping in that right ear.  But no pain or drainage.  HTN - cut lisinopril in half and doing well on this regimen. Doesn't have a home cuff.   He also noticed for the last week or so that he has been getting some itchy spots sometimes on his arms, back, side, legs.  He will notice that it looks a little bit red.  He was outside working about a week ago and that is when he for started noticing it on his hands.  He did try oral Benadryl for couple days and said it did help but it seemed to come right back when he would stop the medication.  Past Medical History:  Diagnosis Date  . AAA (abdominal aortic aneurysm) ([place])   . Hepatitis A 1998   with elevated LFT's  . Hyperlipidemia   . Hypertension   . IFG (impaired fasting glucose)     Past Surgical History:  Procedure Laterality Date  . arthroscopic surgery  1982   left knee    Family History  Problem Relation Age of Onset  . Hypertension Mother   . Hyperlipidemia Mother   . Heart disease Father   . Hyperlipidemia Father   . Hypertension Father   . Hypertension Sister   . Hyperlipidemia Sister   . Hyperlipidemia Brother   . Hypertension Brother   . Heart disease Brother     Social History   Socioeconomic History  . Marital status: Married    Spouse name: Santiago Glad  . Number of children: 2  . Years of education: 48  . Highest education level: Master's degree (e.g., MA, MS, MEng, MEd, MSW, MBA)  Occupational History  . 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 Smoking status: Never Smoker  . Smokeless tobacco: Never Used   Vaping Use  . Vaping Use: Never used  Substance and Sexual Activity  . Alcohol use: Yes    Alcohol/week: 2.0 - 3.0 standard drinks    Types: 2 - 3 Cans of beer per week  . Drug use: No  . Sexual activity: Not Currently  Other Topics Concern  . Not on file  Social History Narrative   Works out regularly.  No caffeine.     Social Determinants of Health   Financial Resource Strain: Low Risk   . Difficulty of Paying Living Expenses: Not hard at all  Food Insecurity: No Food Insecurity  . Worried About Charity fundraiser in the Last Year: Never true  . Ran Out of Food in the Last Year: Never true  Transportation Needs: No Transportation Needs  . Lack of Transportation (Medical): No  . Lack of Transportation (Non-Medical): No  Physical Activity: Sufficiently Active  . Days of Exercise per Week: 7 days  . Minutes of Exercise per Session: 90 min  Stress: No Stress Concern Present  . Feeling of Stress : Not at all  Social Connections: Moderately Isolated  . Frequency of Communication with Friends and Family: More than three times a week  . Frequency of Social  Gatherings with Friends and Family: More than three times a week  . Attends Religious Services: Never  . Active Member of Clubs or Organizations: No  . Attends Archivist Meetings: Never  . Marital Status: Married  Human resources officer Violence: Not At Risk  . Fear of Current or Ex-Partner: No  . Emotionally Abused: No  . Physically Abused: No  . Sexually Abused: No    Outpatient Medications Prior to Visit  Medication Sig Dispense Refill  . atorvastatin (LIPITOR) 40 MG tablet Take 1 tablet (40 mg total) by mouth daily. 90 tablet 3  . ipratropium (ATROVENT) 0.06 % nasal spray Place 1-2 sprays into both nostrils 2 (two) times daily as needed for rhinitis. 15 mL 3  . lisinopril (ZESTRIL) 10 MG tablet Take 1 tablet (10 mg total) by mouth daily. (Patient taking differently: Take 10 mg by mouth daily. Pt taking 5 mg  daily) 90 tablet 3   No facility-administered medications prior to visit.    No Known Allergies  Review of Systems     Objective:    Physical Exam Vitals reviewed.  Constitutional:      Appearance: He is well-developed.  HENT:     Head: Normocephalic and atraumatic.     Comments: Right TM I thickened with erythematous spots on it.  Light reflex is absent.        Right Ear: Ear canal and external ear normal.     Left Ear: Tympanic membrane, ear canal and external ear normal.  Eyes:     Conjunctiva/sclera: Conjunctivae normal.  Cardiovascular:     Rate and Rhythm: Normal rate.  Pulmonary:     Effort: Pulmonary effort is normal.  Skin:    General: Skin is dry.     Coloration: Skin is not pale.     Comments: Mild erythematous patches scattered over her low back.   Neurological:     Mental Status: He is alert and oriented to person, place, and time.  Psychiatric:        Behavior: Behavior normal.     BP 125/67   Pulse 66   Ht 5\' 6"  (1.676 m)   Wt 157 lb (71.2 kg)   SpO2 98%   BMI 25.34 kg/m  Wt Readings from Last 3 Encounters:  02/16/20 157 lb (71.2 kg)  02/02/20 157 lb (71.2 kg)  09/22/19 154 lb (69.9 kg)    There are no preventive care reminders to display for this patient.  There are no preventive care reminders to display for this patient.   No results found for: TSH Lab Results  Component Value Date   WBC 5.0 09/04/2017   HGB 14.6 09/04/2017   HCT 42.5 09/04/2017   MCV 95.1 09/04/2017   PLT 186 09/04/2017   Lab Results  Component Value Date   NA 142 03/31/2019   K 4.2 03/31/2019   CO2 25 03/31/2019   GLUCOSE 99 03/31/2019   BUN 16 03/31/2019   CREATININE 0.88 03/31/2019   BILITOT 0.5 09/22/2019   ALKPHOS 51 04/12/2016   AST 45 (H) 09/22/2019   ALT 17 09/22/2019   PROT 6.7 09/22/2019   ALBUMIN 4.3 04/12/2016   CALCIUM 9.4 03/31/2019   Lab Results  Component Value Date   CHOL 120 03/31/2019   Lab Results  Component Value Date   HDL  47 03/31/2019   Lab Results  Component Value Date   LDLCALC 59 03/31/2019   Lab Results  Component Value Date   TRIG 62  03/31/2019   Lab Results  Component Value Date   CHOLHDL 2.6 03/31/2019   Lab Results  Component Value Date   HGBA1C 5.4 09/22/2019       Assessment & Plan:   Problem List Items Addressed This Visit      Cardiovascular and Mediastinum   HYPERTENSION, BENIGN ESSENTIAL    BP looks great on 5 mg of lisinopril.        Relevant Medications   lisinopril (ZESTRIL) 5 MG tablet    Other Visit Diagnoses    Acute swimmer's ear of left side    -  Primary   Itching         Acute swimmer's ear-the canal itself looks much better.  So I do feel like we have treated the external infection but I am concerned that his tympanic membrane still looks abnormal he still getting some intermittent plugging and fullness some medical head and treat him with Augmentin.  If he is not completely better than I want him to return in 2 weeks for me to recheck the ear.  New onset itching-discussed with him that it sounds like it is allergic it is moving in location he is getting little splotches of erythema though they are not papular.  Recommend a trial of a 24 acting antihistamine daily for the next 2 weeks and at that point try to wean off.    Meds ordered this encounter  Medications  . amoxicillin-clavulanate (AUGMENTIN) 875-125 MG tablet    Sig: Take 1 tablet by mouth 2 (two) times daily.    Dispense:  14 tablet    Refill:  0  . lisinopril (ZESTRIL) 5 MG tablet    Sig: Take 1 tablet (5 mg total) by mouth daily.    Dispense:  1 tablet    Refill:  0     Beatrice Lecher, MD

## 2020-02-16 NOTE — Patient Instructions (Signed)
Please start a 24 hour acting antihistamine like claritin or zyrtec or Allegra.

## 2020-02-16 NOTE — Progress Notes (Signed)
He finished the ear drops last Monday.

## 2020-02-18 ENCOUNTER — Telehealth: Payer: Self-pay

## 2020-02-18 NOTE — Telephone Encounter (Signed)
Yes, okay to swim with the rash.  Just the if he feels like the chlorine is actually irritating it or not.

## 2020-02-18 NOTE — Telephone Encounter (Signed)
Blanchard called and left a message. He wanted to know if he can swim with his current rash. Please advise.

## 2020-02-18 NOTE — Telephone Encounter (Signed)
Patient advised.

## 2020-03-23 ENCOUNTER — Ambulatory Visit: Payer: Medicare HMO | Admitting: Family Medicine

## 2020-04-05 ENCOUNTER — Ambulatory Visit: Payer: Medicare HMO | Admitting: Family Medicine

## 2020-04-07 ENCOUNTER — Encounter: Payer: Self-pay | Admitting: Family Medicine

## 2020-04-07 ENCOUNTER — Other Ambulatory Visit: Payer: Self-pay

## 2020-04-07 ENCOUNTER — Ambulatory Visit (INDEPENDENT_AMBULATORY_CARE_PROVIDER_SITE_OTHER): Payer: Medicare HMO | Admitting: Family Medicine

## 2020-04-07 VITALS — BP 124/89 | HR 59 | Ht 66.0 in | Wt 155.0 lb

## 2020-04-07 DIAGNOSIS — E785 Hyperlipidemia, unspecified: Secondary | ICD-10-CM

## 2020-04-07 DIAGNOSIS — J310 Chronic rhinitis: Secondary | ICD-10-CM | POA: Diagnosis not present

## 2020-04-07 DIAGNOSIS — R972 Elevated prostate specific antigen [PSA]: Secondary | ICD-10-CM | POA: Diagnosis not present

## 2020-04-07 DIAGNOSIS — R7301 Impaired fasting glucose: Secondary | ICD-10-CM

## 2020-04-07 DIAGNOSIS — I10 Essential (primary) hypertension: Secondary | ICD-10-CM

## 2020-04-07 LAB — LIPID PANEL W/REFLEX DIRECT LDL
Cholesterol: 121 mg/dL (ref <200)
HDL: 47 mg/dL (ref >=40)
LDL Cholesterol (Calc): 60 mg/dL (calc)
Non-HDL Cholesterol (Calc): 74 mg/dL (calc) (ref <130)
Total CHOL/HDL Ratio: 2.6 (calc) (ref <5.0)
Triglycerides: 61 mg/dL (ref <150)

## 2020-04-07 LAB — POCT GLYCOSYLATED HEMOGLOBIN (HGB A1C): Hemoglobin A1C: 5.2 % (ref 4.0–5.6)

## 2020-04-07 LAB — COMPLETE METABOLIC PANEL WITH GFR
AG Ratio: 1.7 (calc) (ref 1.0–2.5)
ALT: 20 U/L (ref 9–46)
AST: 46 U/L — ABNORMAL HIGH (ref 10–35)
Albumin: 4.5 g/dL (ref 3.6–5.1)
Alkaline phosphatase (APISO): 76 U/L (ref 35–144)
BUN: 18 mg/dL (ref 7–25)
CO2: 28 mmol/L (ref 20–32)
Calcium: 9.5 mg/dL (ref 8.6–10.3)
Chloride: 106 mmol/L (ref 98–110)
Creat: 1.02 mg/dL (ref 0.70–1.18)
GFR, Est African American: 83 mL/min/{1.73_m2} (ref >=60)
GFR, Est Non African American: 72 mL/min/{1.73_m2} (ref >=60)
Globulin: 2.7 g/dL (calc) (ref 1.9–3.7)
Glucose, Bld: 92 mg/dL (ref 65–99)
Potassium: 4.1 mmol/L (ref 3.5–5.3)
Sodium: 140 mmol/L (ref 135–146)
Total Bilirubin: 0.9 mg/dL (ref 0.2–1.2)
Total Protein: 7.2 g/dL (ref 6.1–8.1)

## 2020-04-07 LAB — PSA: PSA: 1.62 ng/mL (ref <=4.0)

## 2020-04-07 MED ORDER — LISINOPRIL 5 MG PO TABS: 5.0000 mg | ORAL_TABLET | Freq: Every day | ORAL | 3 refills | Status: DC

## 2020-04-07 MED ORDER — IPRATROPIUM BROMIDE 0.06 % NA SOLN: 1.0000 | Freq: Two times a day (BID) | NASAL | 3 refills | Status: DC | PRN

## 2020-04-07 MED ORDER — ATORVASTATIN CALCIUM 40 MG PO TABS: 40.0000 mg | ORAL_TABLET | Freq: Every day | ORAL | 3 refills | Status: DC

## 2020-04-07 NOTE — Assessment & Plan Note (Signed)
Rating statin well.  Continue current regimen.  Due for updated lipid panels.

## 2020-04-07 NOTE — Assessment & Plan Note (Signed)
Well controlled. Continue current regimen. Follow up in  6 mo  

## 2020-04-07 NOTE — Assessment & Plan Note (Signed)
Well controlled. Continue current regimen. Follow up in  6-12 mo  

## 2020-04-07 NOTE — Assessment & Plan Note (Signed)
Did have some mild improvement with the Astelin.  He recently bumped it up to twice a day to see if that makes a difference as well.  Also discussed possible referral to ENT if his symptoms are not improving.  I know it has been a little frustrating that he gets the congestion after swimming.

## 2020-04-07 NOTE — Assessment & Plan Note (Signed)
Due to recheck yearly PSA.

## 2020-04-07 NOTE — Progress Notes (Signed)
Established Patient Office Visit  Subjective:  Patient ID: Brad Cannon, male    DOB: [birth year]  Age: 75 y.o. MRN: [id number]  CC:  Chief Complaint  Patient presents with  . ifg  . Hypertension    HPI Brad Cannon presents for   Hypertension- Pt denies chest pain, SOB, dizziness, or heart palpitations.  Taking meds as directed w/o problems.  Denies medication side effects.    Impaired fasting glucose-no increased thirst or urination. No symptoms consistent with hypoglycemia.  Hyperlipidemia - tolerating stating well with no myalgias or significant side effects.  Lab Results  Component Value Date   CHOL 120 03/31/2019   HDL 47 03/31/2019   LDLCALC 59 03/31/2019   TRIG 62 03/31/2019   CHOLHDL 2.6 03/31/2019    Still struggling with some nasal congestion and after he swims for exercise it usually is a little better by the next day we had started him on some Astelin when I last saw him he feels like it helps some but he still gets significant congestion afterwards and is just not sure why.  He has been wearing earplugs now to avoid any ear infection he says yesterday his right ear was bothering him after his swim so he just wanted me to look at it he did wear his plugs yesterday.   Past Medical History:  Diagnosis Date  . AAA (abdominal aortic aneurysm) ([place])   . Hepatitis A 1998   with elevated LFT's  . Hyperlipidemia   . Hypertension   . IFG (impaired fasting glucose)     Past Surgical History:  Procedure Laterality Date  . arthroscopic surgery  1982   left knee    Family History  Problem Relation Age of Onset  . Hypertension Mother   . Hyperlipidemia Mother   . Heart disease Father   . Hyperlipidemia Father   . Hypertension Father   . Hypertension Sister   . Hyperlipidemia Sister   . Hyperlipidemia Brother   . Hypertension Brother   . Heart disease Brother     Social History   Socioeconomic History  . Marital status: Married    Spouse  name: Brad Cannon  . Number of children: 2  . Years of education: 74  . Highest education level: Master's degree (e.g., MA, MS, MEng, MEd, MSW, MBA)  Occupational History  . 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 Smoking status: Never Smoker  . Smokeless tobacco: Never Used  Vaping Use  . Vaping Use: Never used  Substance and Sexual Activity  . Alcohol use: Yes    Alcohol/week: 2.0 - 3.0 standard drinks    Types: 2 - 3 Cans of beer per week  . Drug use: No  . Sexual activity: Not Currently  Other Topics Concern  . Not on file  Social History Narrative   Works out regularly.  No caffeine.     Social Determinants of Health   Financial Resource Strain: Low Risk   . Difficulty of Paying Living Expenses: Not hard at all  Food Insecurity: No Food Insecurity  . Worried About Charity fundraiser in the Last Year: Never true  . Ran Out of Food in the Last Year: Never true  Transportation Needs: No Transportation Needs  . Lack of Transportation (Medical): No  . Lack of Transportation (Non-Medical): No  Physical Activity: Sufficiently Active  . Days of Exercise per Week: 7 days  . Minutes of Exercise per Session: 90  min  Stress: No Stress Concern Present  . Feeling of Stress : Not at all  Social Connections: Moderately Isolated  . Frequency of Communication with Friends and Family: More than three times a week  . Frequency of Social Gatherings with Friends and Family: More than three times a week  . Attends Religious Services: Never  . Active Member of Clubs or Organizations: No  . Attends Banker Meetings: Never  . Marital Status: Married  Catering manager Violence: Not At Risk  . Fear of Current or Ex-Partner: No  . Emotionally Abused: No  . Physically Abused: No  . Sexually Abused: No    Outpatient Medications Prior to Visit  Medication Sig Dispense Refill  . atorvastatin (LIPITOR) 40 MG tablet Take 1 tablet (40 mg total) by mouth daily.  90 tablet 3  . ipratropium (ATROVENT) 0.06 % nasal spray Place 1-2 sprays into both nostrils 2 (two) times daily as needed for rhinitis. 15 mL 3  . lisinopril (ZESTRIL) 5 MG tablet Take 1 tablet (5 mg total) by mouth daily. 1 tablet 0  . amoxicillin-clavulanate (AUGMENTIN) 875-125 MG tablet Take 1 tablet by mouth 2 (two) times daily. 14 tablet 0   No facility-administered medications prior to visit.    No Known Allergies  ROS Review of Systems    Objective:    Physical Exam Constitutional:      Appearance: He is well-developed and well-nourished.  HENT:     Head: Normocephalic and atraumatic.  Cardiovascular:     Rate and Rhythm: Normal rate and regular rhythm.     Heart sounds: Normal heart sounds.  Pulmonary:     Effort: Pulmonary effort is normal.     Breath sounds: Normal breath sounds.  Skin:    General: Skin is warm and dry.  Neurological:     Mental Status: He is alert and oriented to person, place, and time.  Psychiatric:        Mood and Affect: Mood and affect normal.        Behavior: Behavior normal.     BP 124/89   Pulse (!) 59   Ht 5\' 6"  (1.676 m)   Wt 155 lb (70.3 kg)   SpO2 99%   BMI 25.02 kg/m  Wt Readings from Last 3 Encounters:  04/07/20 155 lb (70.3 kg)  02/16/20 157 lb (71.2 kg)  02/02/20 157 lb (71.2 kg)     Health Maintenance Due  Topic Date Due  . COVID-19 Vaccine (3 - Moderna risk 4-dose series) 07/28/2019    There are no preventive care reminders to display for this patient.  No results found for: TSH Lab Results  Component Value Date   WBC 5.0 09/04/2017   HGB 14.6 09/04/2017   HCT 42.5 09/04/2017   MCV 95.1 09/04/2017   PLT 186 09/04/2017   Lab Results  Component Value Date   NA 142 03/31/2019   K 4.2 03/31/2019   CO2 25 03/31/2019   GLUCOSE 99 03/31/2019   BUN 16 03/31/2019   CREATININE 0.88 03/31/2019   BILITOT 0.5 09/22/2019   ALKPHOS 51 04/12/2016   AST 45 (H) 09/22/2019   ALT 17 09/22/2019   PROT 6.7  09/22/2019   ALBUMIN 4.3 04/12/2016   CALCIUM 9.4 03/31/2019   Lab Results  Component Value Date   CHOL 120 03/31/2019   Lab Results  Component Value Date   HDL 47 03/31/2019   Lab Results  Component Value Date   LDLCALC 59 03/31/2019  Lab Results  Component Value Date   TRIG 62 03/31/2019   Lab Results  Component Value Date   CHOLHDL 2.6 03/31/2019   Lab Results  Component Value Date   HGBA1C 5.2 04/07/2020      Assessment & Plan:   Problem List Items Addressed This Visit      Cardiovascular and Mediastinum   HYPERTENSION, BENIGN ESSENTIAL    Well controlled. Continue current regimen. Follow up in  6 mo      Relevant Medications   lisinopril (ZESTRIL) 5 MG tablet   atorvastatin (LIPITOR) 40 MG tablet   Other Relevant Orders   COMPLETE METABOLIC PANEL WITH GFR   PSA   Lipid Panel w/reflex Direct LDL     Respiratory   Rhinitis    Did have some mild improvement with the Astelin.  He recently bumped it up to twice a day to see if that makes a difference as well.  Also discussed possible referral to ENT if his symptoms are not improving.  I know it has been a little frustrating that he gets the congestion after swimming.      Relevant Medications   ipratropium (ATROVENT) 0.06 % nasal spray     Endocrine   IFG (impaired fasting glucose) - Primary    Well controlled. Continue current regimen. Follow up in  6-12 mo      Relevant Orders   POCT glycosylated hemoglobin (Hb A1C) (Completed)   COMPLETE METABOLIC PANEL WITH GFR   PSA   Lipid Panel w/reflex Direct LDL     Other   Hyperlipemia    Rating statin well.  Continue current regimen.  Due for updated lipid panels.      Relevant Medications   lisinopril (ZESTRIL) 5 MG tablet   atorvastatin (LIPITOR) 40 MG tablet   Other Relevant Orders   COMPLETE METABOLIC PANEL WITH GFR   PSA   Lipid Panel w/reflex Direct LDL   ELEVATED PROSTATE SPECIFIC ANTIGEN    Due to recheck yearly PSA.      Relevant  Orders   PSA      Meds ordered this encounter  Medications  . lisinopril (ZESTRIL) 5 MG tablet    Sig: Take 1 tablet (5 mg total) by mouth daily.    Dispense:  90 tablet    Refill:  3  . atorvastatin (LIPITOR) 40 MG tablet    Sig: Take 1 tablet (40 mg total) by mouth daily.    Dispense:  90 tablet    Refill:  3  . ipratropium (ATROVENT) 0.06 % nasal spray    Sig: Place 1-2 sprays into both nostrils 2 (two) times daily as needed for rhinitis.    Dispense:  15 mL    Refill:  3    Follow-up: Return in about 6 months (around 10/05/2020) for Hypertension.    Beatrice Lecher, MD

## 2020-04-12 ENCOUNTER — Other Ambulatory Visit: Payer: Self-pay | Admitting: *Deleted

## 2020-04-12 DIAGNOSIS — D229 Melanocytic nevi, unspecified: Secondary | ICD-10-CM

## 2020-04-15 ENCOUNTER — Other Ambulatory Visit: Payer: Self-pay

## 2020-04-15 DIAGNOSIS — I10 Essential (primary) hypertension: Secondary | ICD-10-CM

## 2020-04-15 MED ORDER — LISINOPRIL 5 MG PO TABS: 5.0000 mg | ORAL_TABLET | Freq: Every day | ORAL | 3 refills | Status: DC

## 2020-05-17 DIAGNOSIS — L821 Other seborrheic keratosis: Secondary | ICD-10-CM | POA: Diagnosis not present

## 2020-05-20 ENCOUNTER — Other Ambulatory Visit: Payer: Self-pay

## 2020-05-20 ENCOUNTER — Ambulatory Visit (INDEPENDENT_AMBULATORY_CARE_PROVIDER_SITE_OTHER): Payer: Medicare HMO | Admitting: Family Medicine

## 2020-05-20 ENCOUNTER — Encounter: Payer: Self-pay | Admitting: Family Medicine

## 2020-05-20 VITALS — BP 107/56 | HR 62 | Ht 66.0 in | Wt 157.0 lb

## 2020-05-20 DIAGNOSIS — R0789 Other chest pain: Secondary | ICD-10-CM

## 2020-05-20 DIAGNOSIS — K2101 Gastro-esophageal reflux disease with esophagitis, with bleeding: Secondary | ICD-10-CM

## 2020-05-20 MED ORDER — PANTOPRAZOLE SODIUM 40 MG PO TBEC
40.0000 mg | DELAYED_RELEASE_TABLET | Freq: Every day | ORAL | 1 refills | Status: DC
Start: 2020-05-20 — End: 2020-05-20

## 2020-05-20 MED ORDER — PANTOPRAZOLE SODIUM 40 MG PO TBEC: 40.0000 mg | DELAYED_RELEASE_TABLET | Freq: Every day | ORAL | 1 refills | Status: DC

## 2020-05-20 NOTE — Progress Notes (Signed)
Pt reports that he has been belching more. He hasn't had any changes to his diet. He did cut back on coffee.   sxs are worse at night, a few weeks ago he had experienced some pain about his belly button.  He has tried Tums for this which hasn't made much difference.

## 2020-05-20 NOTE — Progress Notes (Signed)
Established Patient Office Visit  Subjective:  Patient ID: Brad Cannon, male    DOB: 1945-04-30  Age: 75 y.o. MRN: [id number]  CC:  Chief Complaint  Patient presents with  . Gastroesophageal Reflux    HPI [name] presents for new onset reflux.  He says once in a while he gets some reflux symptoms but he is never had it like this.  He says about 2 weeks ago he just charted noticing that he was having a lot of belching.  Not every day but frequently enough that it seemed excessive.  About a week ago he actually had 2 days of frequent diarrhea but that since has resolved.  He also has been having some intermittent midsternal chest pain.  He says he does get relief when he belches but it is temporary it feels like it just starts to build back up again.  He has had a couple of instances of brash.  He denies any numbness or tingling in the extremities.  He denies any problems swallowing.  No recent medication changes.  He actually has cut back on his caffeine.  No one else has been sick at home.  Past Medical History:  Diagnosis Date  . AAA (abdominal aortic aneurysm) ([place])   . Hepatitis A 1998   with elevated LFT's  . Hyperlipidemia   . Hypertension   . IFG (impaired fasting glucose)     Past Surgical History:  Procedure Laterality Date  . arthroscopic surgery  1982   left knee    Family History  Problem Relation Age of Onset  . Hypertension Mother   . Hyperlipidemia Mother   . Heart disease Father   . Hyperlipidemia Father   . Hypertension Father   . Hypertension Sister   . Hyperlipidemia Sister   . Hyperlipidemia Brother   . Hypertension Brother   . Heart disease Brother     Social History   Socioeconomic History  . Marital status: Married    Spouse name: Santiago Glad  . Number of children: 2  . Years of education: 47  . Highest education level: Master's degree (e.g., MA, MS, MEng, MEd, MSW, MBA)  Occupational History  . 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 Smoking status: Never Smoker  . Smokeless tobacco: Never Used  Vaping Use  . Vaping Use: Never used  Substance and Sexual Activity  . Alcohol use: Yes    Alcohol/week: 2.0 - 3.0 standard drinks    Types: 2 - 3 Cans of beer per week  . Drug use: No  . Sexual activity: Not Currently  Other Topics Concern  . Not on file  Social History Narrative   Works out regularly.  No caffeine.     Social Determinants of Health   Financial Resource Strain: Low Risk   . Difficulty of Paying Living Expenses: Not hard at all  Food Insecurity: No Food Insecurity  . Worried About Charity fundraiser in the Last Year: Never true  . Ran Out of Food in the Last Year: Never true  Transportation Needs: No Transportation Needs  . Lack of Transportation (Medical): No  . Lack of Transportation (Non-Medical): No  Physical Activity: Sufficiently Active  . Days of Exercise per Week: 7 days  . Minutes of Exercise per Session: 90 min  Stress: No Stress Concern Present  . Feeling of Stress : Not at all  Social Connections: Moderately Isolated  . Frequency of Communication  with Friends and Family: More than three times a week  . Frequency of Social Gatherings with Friends and Family: More than three times a week  . Attends Religious Services: Never  . Active Member of Clubs or Organizations: No  . Attends Archivist Meetings: Never  . Marital Status: Married  Human resources officer Violence: Not At Risk  . Fear of Current or Ex-Partner: No  . Emotionally Abused: No  . Physically Abused: No  . Sexually Abused: No    Outpatient Medications Prior to Visit  Medication Sig Dispense Refill  . atorvastatin (LIPITOR) 40 MG tablet Take 1 tablet (40 mg total) by mouth daily. 90 tablet 3  . ipratropium (ATROVENT) 0.06 % nasal spray Place 1-2 sprays into both nostrils 2 (two) times daily as needed for rhinitis. 15 mL 3  . lisinopril (ZESTRIL) 5 MG tablet Take 1 tablet (5 mg  total) by mouth daily. 90 tablet 3   No facility-administered medications prior to visit.    No Known Allergies  ROS Review of Systems    Objective:    Physical Exam Constitutional:      Appearance: He is well-developed and well-nourished.  HENT:     Head: Normocephalic and atraumatic.     Right Ear: Tympanic membrane, ear canal and external ear normal.     Left Ear: Tympanic membrane, ear canal and external ear normal.     Nose: Nose normal.     Mouth/Throat:     Mouth: Mucous membranes are moist.     Pharynx: Oropharynx is clear. No posterior oropharyngeal erythema.  Eyes:     Conjunctiva/sclera: Conjunctivae normal.     Pupils: Pupils are equal, round, and reactive to light.  Neck:     Thyroid: No thyromegaly.  Cardiovascular:     Rate and Rhythm: Normal rate and regular rhythm.     Heart sounds: Normal heart sounds.  Pulmonary:     Effort: Pulmonary effort is normal.     Breath sounds: Normal breath sounds.  Abdominal:     General: Abdomen is flat. Bowel sounds are normal.     Palpations: Abdomen is soft.  Musculoskeletal:     Cervical back: Neck supple.  Lymphadenopathy:     Cervical: No cervical adenopathy.  Skin:    General: Skin is warm and dry.  Neurological:     Mental Status: He is alert and oriented to person, place, and time.  Psychiatric:        Mood and Affect: Mood and affect and mood normal.        Behavior: Behavior normal.     BP (!) 107/56   Pulse 62   Ht 5\' 6"  (1.676 m)   Wt 157 lb (71.2 kg)   SpO2 99%   BMI 25.34 kg/m  Wt Readings from Last 3 Encounters:  05/20/20 157 lb (71.2 kg)  04/07/20 155 lb (70.3 kg)  02/16/20 157 lb (71.2 kg)     Health Maintenance Due  Topic Date Due  . COVID-19 Vaccine (3 - Moderna risk 4-dose series) 07/28/2019    There are no preventive care reminders to display for this patient.  No results found for: TSH Lab Results  Component Value Date   WBC 6.5 05/20/2020   HGB 14.5 05/20/2020   HCT  43.2 05/20/2020   MCV 98.4 05/20/2020   PLT 207 05/20/2020   Lab Results  Component Value Date   NA 140 05/20/2020   K 4.3 05/20/2020   CO2 26  05/20/2020   GLUCOSE 93 05/20/2020   BUN 24 05/20/2020   CREATININE 0.94 05/20/2020   BILITOT 0.7 05/20/2020   ALKPHOS 51 04/12/2016   AST 44 (H) 05/20/2020   ALT 16 05/20/2020   PROT 7.0 05/20/2020   ALBUMIN 4.3 04/12/2016   CALCIUM 9.1 05/20/2020   Lab Results  Component Value Date   CHOL 121 04/07/2020   Lab Results  Component Value Date   HDL 47 04/07/2020   Lab Results  Component Value Date   LDLCALC 60 04/07/2020   Lab Results  Component Value Date   TRIG 61 04/07/2020   Lab Results  Component Value Date   CHOLHDL 2.6 04/07/2020   Lab Results  Component Value Date   HGBA1C 5.2 04/07/2020      Assessment & Plan:   Problem List Items Addressed This Visit   None   Visit Diagnoses    Atypical chest pain    -  Primary   Relevant Orders   EKG 12-Lead (Completed)   Lipase (Completed)   COMPLETE METABOLIC PANEL WITH GFR (Completed)   CBC (Completed)   Troponin I (Completed)   Gastroesophageal reflux disease with esophagitis and hemorrhage       Relevant Medications   pantoprazole (PROTONIX) 40 MG tablet   Other Relevant Orders   EKG 12-Lead (Completed)   Lipase (Completed)   COMPLETE METABOLIC PANEL WITH GFR (Completed)   CBC (Completed)     Atypical chest pain-suspect most likely related to GERD it seems most consistent.  But I also want evaluate for the possibility of cardiac disease.  I actually suspect that he probably had some type of either food poisoning or viral illness as this also started with about 2 days of diarrhea which is now resolved.  Rena start him on a PPI for symptom relief at least for the next couple weeks to see if this helps.  In the meantime we will do an EKG as well as check labs.  Evaluate for elevated liver enzymes as well as pancreatitis.  He is nontender on exam which is  reassuring.  GERD/brash-we will go ahead and start Protonix.  To see if this provides some symptom relief as well.  EKG today shows rate of 57 bpm, sinus bradycardia with first-degree AV block with fusion complexes.  Really does not look significantly changed from old EKG from 2009.  Meds ordered this encounter  Medications  . DISCONTD: pantoprazole (PROTONIX) 40 MG tablet    Sig: Take 1 tablet (40 mg total) by mouth at bedtime.    Dispense:  30 tablet    Refill:  1  . pantoprazole (PROTONIX) 40 MG tablet    Sig: Take 1 tablet (40 mg total) by mouth at bedtime.    Dispense:  30 tablet    Refill:  1    Follow-up: Return if symptoms worsen or fail to improve.    Beatrice Lecher, MD

## 2020-05-21 LAB — CBC
HCT: 43.2 % (ref 38.5–50.0)
Hemoglobin: 14.5 g/dL (ref 13.2–17.1)
MCH: 33 pg (ref 27.0–33.0)
MCHC: 33.6 g/dL (ref 32.0–36.0)
MCV: 98.4 fL (ref 80.0–100.0)
MPV: 12.7 fL — ABNORMAL HIGH (ref 7.5–12.5)
Platelets: 207 10*3/uL (ref 140–400)
RBC: 4.39 10*6/uL (ref 4.20–5.80)
RDW: 11.8 % (ref 11.0–15.0)
WBC: 6.5 10*3/uL (ref 3.8–10.8)

## 2020-05-21 LAB — COMPLETE METABOLIC PANEL WITH GFR
AG Ratio: 1.6 (calc) (ref 1.0–2.5)
ALT: 16 U/L (ref 9–46)
AST: 44 U/L — ABNORMAL HIGH (ref 10–35)
Albumin: 4.3 g/dL (ref 3.6–5.1)
Alkaline phosphatase (APISO): 79 U/L (ref 35–144)
BUN: 24 mg/dL (ref 7–25)
CO2: 26 mmol/L (ref 20–32)
Calcium: 9.1 mg/dL (ref 8.6–10.3)
Chloride: 106 mmol/L (ref 98–110)
Creat: 0.94 mg/dL (ref 0.70–1.18)
GFR, Est African American: 92 mL/min/{1.73_m2} (ref >=60)
GFR, Est Non African American: 79 mL/min/{1.73_m2} (ref >=60)
Globulin: 2.7 g/dL (calc) (ref 1.9–3.7)
Glucose, Bld: 93 mg/dL (ref 65–99)
Potassium: 4.3 mmol/L (ref 3.5–5.3)
Sodium: 140 mmol/L (ref 135–146)
Total Bilirubin: 0.7 mg/dL (ref 0.2–1.2)
Total Protein: 7 g/dL (ref 6.1–8.1)

## 2020-05-21 LAB — LIPASE: Lipase: 83 U/L — ABNORMAL HIGH (ref 7–60)

## 2020-05-21 LAB — EXTRA SPECIMEN

## 2020-05-21 LAB — TROPONIN I: Troponin I: 4 ng/L (ref <=47)

## 2020-06-14 ENCOUNTER — Telehealth: Payer: Self-pay | Admitting: Family Medicine

## 2020-06-14 DIAGNOSIS — J310 Chronic rhinitis: Secondary | ICD-10-CM

## 2020-06-14 NOTE — Telephone Encounter (Signed)
Okay to place order? °

## 2020-06-14 NOTE — Telephone Encounter (Signed)
Pt called and left a VM stating that he had previously discussed during an appt with Dr. Madilyn Fireman that when he swims, he develops issues with his sinus's and she offered a ENT referral and now he would like to proceed with the ENT referral being put in for him.

## 2020-06-15 NOTE — Telephone Encounter (Addendum)
ENT referral placed.   Called pt and informed pt of  referral

## 2020-07-02 DIAGNOSIS — J342 Deviated nasal septum: Secondary | ICD-10-CM | POA: Diagnosis not present

## 2020-07-02 DIAGNOSIS — J300 Vasomotor rhinitis: Secondary | ICD-10-CM | POA: Diagnosis not present

## 2020-09-01 DIAGNOSIS — Z20822 Contact with and (suspected) exposure to covid-19: Secondary | ICD-10-CM | POA: Diagnosis not present

## 2020-09-23 ENCOUNTER — Ambulatory Visit (INDEPENDENT_AMBULATORY_CARE_PROVIDER_SITE_OTHER): Payer: Medicare HMO | Admitting: Family Medicine

## 2020-09-23 ENCOUNTER — Other Ambulatory Visit: Payer: Self-pay

## 2020-09-23 ENCOUNTER — Encounter: Payer: Self-pay | Admitting: Family Medicine

## 2020-09-23 VITALS — BP 148/77 | HR 62 | Temp 97.8°F | Resp 18

## 2020-09-23 DIAGNOSIS — L237 Allergic contact dermatitis due to plants, except food: Secondary | ICD-10-CM

## 2020-09-23 MED ORDER — CLOBETASOL PROPIONATE 0.05 % EX OINT
1.0000 | TOPICAL_OINTMENT | Freq: Two times a day (BID) | CUTANEOUS | 1 refills | Status: DC
Start: 2020-09-23 — End: 2020-10-05

## 2020-09-23 NOTE — Progress Notes (Signed)
Acute Office Visit  Subjective:    Patient ID: Brad Cannon, male    DOB: Jan 04, 1946, 75 y.o.   MRN: [id number]  Chief Complaint  Patient presents with   Rash    HPI Patient is in today for rash, possible poison ivy.   Patient states about 1 to 2 weeks ago he noticed a rash that began on the back of his legs a couple days after mowing his yard.  Reports that he backs up the grass manually.  Since the rash originally appeared, he has noticed small areas throughout his entire body start to pop up.  He has a few small spots to his bilateral upper arms, forearms, angers, lower legs.  Each area is red with a few small blisters and very itchy.  He denies any drainage or pain.  States he found some old clobetasol ointment that he had used in the past for poison ivy and started using it on Friday with some improvement, however he is almost out and this tube is expired.  He has also had to use occasional Benadryl for the itching.  He denies any other rashes, body aches, fevers, trouble breathing, fatigue.    Past Medical History:  Diagnosis Date   AAA (abdominal aortic aneurysm) ([place]    Hepatitis A 1998   with elevated LFT's   Hyperlipidemia    Hypertension    IFG (impaired fasting glucose)     Past Surgical History:  Procedure Laterality Date   arthroscopic surgery  1982   left knee    Family History  Problem Relation Age of Onset   Hypertension Mother    Hyperlipidemia Mother    Heart disease Father    Hyperlipidemia Father    Hypertension Father    Hypertension Sister    Hyperlipidemia Sister    Hyperlipidemia Brother    Hypertension Brother    Heart disease Brother     Social History   Socioeconomic History   Marital status: Married    Spouse name: Santiago Glad   Number of children: 2   Years of education: 18   Highest education level: Master's degree (e.g., MA, MS, MEng, MEd, MSW, MBA)  Occupational History   Occupation: Teacher, 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  No caffeine.     Social Determinants of Health   Financial Resource Strain: Low Risk    Difficulty of Paying Living Expenses: Not hard at all  Food Insecurity: No Food Insecurity   Worried About Charity fundraiser in the Last Year: Never true   [place] in the Last Year: Never true  Transportation Needs: No Transportation Needs   Lack of Transportation (Medical): No   Lack of Transportation (Non-Medical): No  Physical Activity: Sufficiently Active   Days of Exercise per Week: 7 days   Minutes of Exercise per Session: 90 min  Stress: No Stress Concern Present   Feeling of Stress : Not at all  Social Connections: Moderately Isolated   Frequency of Communication  with Friends and Family: More than three times a week   Frequency of Social Gatherings with Friends and Family: More than three times a week   Attends Religious Services: Never   Marine scientist or Organizations: No   Attends Music therapist: Never   Marital Status: Married  Human resources officer Violence: Not At Risk   Fear of Current or Ex-Partner: No   Emotionally Abused: No   Physically Abused: No   Sexually Abused: No    Outpatient Medications Prior to Visit  Medication Sig Dispense Refill   atorvastatin (LIPITOR) 40 MG tablet Take 1 tablet (40 mg total) by mouth daily. 90 tablet 3   ipratropium (ATROVENT) 0.06 % nasal spray Place 1-2 sprays into both nostrils 2 (two) times daily as needed for rhinitis. 15 mL 3   lisinopril (ZESTRIL) 5 MG tablet Take 1 tablet (5 mg total) by mouth daily. 90 tablet 3   pantoprazole  (PROTONIX) 40 MG tablet Take 1 tablet (40 mg total) by mouth at bedtime. 30 tablet 1   No facility-administered medications prior to visit.    No Known Allergies  Review of Systems All review of systems negative except what is listed in the HPI     Objective:    Physical Exam Vitals reviewed.  Constitutional:      Appearance: Normal appearance.  Skin:    General: Skin is warm and dry.     Findings: Rash present.     Comments: Multiple areas of small vesicular clusters, mostly in linear pattern consistent with poison ivy contact dermatitis to right elbow, forearm , thumb, knee, thigh, lower leg and left forearm, hand, lower leg. No drainage, edema, induration.  Neurological:     Mental Status: He is alert and oriented to person, place, and time.  Psychiatric:        Mood and Affect: Mood normal.        Behavior: Behavior normal.        Thought Content: Thought content normal.        Judgment: Judgment normal.    BP (!) 148/77   Pulse 62   Temp 97.8 F (36.6 C)   Resp 18   SpO2 97%  Wt Readings from Last 3 Encounters:  05/20/20 157 lb (71.2 kg)  04/07/20 155 lb (70.3 kg)  02/16/20 157 lb (71.2 kg)    Health Maintenance Due  Topic Date Due   Zoster Vaccines- Shingrix (1 of 2) Never done   COVID-19 Vaccine (3 - Moderna risk series) 07/28/2019    There are no preventive care reminders to display for this patient.   No results found for: TSH Lab Results  Component Value Date   WBC 6.5 05/20/2020   HGB 14.5 05/20/2020   HCT 43.2 05/20/2020   MCV 98.4 05/20/2020   PLT 207 05/20/2020   Lab Results  Component Value Date   NA 140 05/20/2020   K 4.3 05/20/2020   CO2 26 05/20/2020   GLUCOSE 93 05/20/2020   BUN 24 05/20/2020   CREATININE 0.94 05/20/2020   BILITOT 0.7 05/20/2020   ALKPHOS 51 04/12/2016   AST 44 (H) 05/20/2020   ALT 16 05/20/2020   PROT 7.0 05/20/2020   ALBUMIN 4.3 04/12/2016   CALCIUM 9.1 05/20/2020   Lab Results  Component Value Date    CHOL 121 04/07/2020   Lab Results  Component Value Date   HDL 47 04/07/2020   Lab Results  Component Value Date   LDLCALC  60 04/07/2020   Lab Results  Component Value Date   TRIG 61 04/07/2020   Lab Results  Component Value Date   CHOLHDL 2.6 04/07/2020   Lab Results  Component Value Date   HGBA1C 5.2 04/07/2020       Assessment & Plan:   1. Allergic dermatitis due to poison ivy  History and presentation consistent with poison ivy, no signs of infection.  Given the small size of each location will refill his clobetasol ointment.  Can do occasional Benadryl but discouraged over using.  Educational handout added to AVS with other supportive measures including calamine lotion, oatmeal baths etc. if not noticing some improvement by Monday or if more areas continue to erupt, can consider oral steroids. Patient aware of signs/symptoms requiring further/urgent evaluation.   - clobetasol ointment (TEMOVATE) 0.05 %; Apply 1 application topically 2 (two) times daily. To affected area(s) as needed, max 2 weeks to avoid whitening/thinning skin  Dispense: 30 g; Refill: 1   Follow-up if symptoms worsen or fail to improve.    Terrilyn Saver, NP

## 2020-09-23 NOTE — Patient Instructions (Signed)
If not starting to improve or if worsening by Monday, please let us know.

## 2020-10-05 ENCOUNTER — Other Ambulatory Visit: Payer: Self-pay

## 2020-10-05 ENCOUNTER — Encounter: Payer: Self-pay | Admitting: Family Medicine

## 2020-10-05 ENCOUNTER — Ambulatory Visit (INDEPENDENT_AMBULATORY_CARE_PROVIDER_SITE_OTHER): Payer: Medicare HMO | Admitting: Family Medicine

## 2020-10-05 VITALS — BP 119/66 | HR 66 | Ht 66.0 in | Wt 158.0 lb

## 2020-10-05 DIAGNOSIS — I10 Essential (primary) hypertension: Secondary | ICD-10-CM

## 2020-10-05 DIAGNOSIS — R7301 Impaired fasting glucose: Secondary | ICD-10-CM

## 2020-10-05 DIAGNOSIS — I77811 Abdominal aortic ectasia: Secondary | ICD-10-CM

## 2020-10-05 DIAGNOSIS — E785 Hyperlipidemia, unspecified: Secondary | ICD-10-CM

## 2020-10-05 NOTE — Assessment & Plan Note (Signed)
Plan to recheck in 6 o

## 2020-10-05 NOTE — Assessment & Plan Note (Signed)
Well controlled. Continue current regimen. Follow up in  6 mo  

## 2020-10-05 NOTE — Progress Notes (Signed)
Established Patient Office Visit  Subjective:  Patient ID: Brad Cannon, male    DOB: Dec 10, 1945  Age: 75 y.o. MRN: [id number]  CC:  Chief Complaint  Patient presents with   Hypertension    HPI Brad Cannon presents for 6 mo f/u   Just got back from an [place] cruise about a month ago and had a wonderful time! .    Hypertension- Pt denies chest pain, SOB, dizziness, or heart palpitations.  Taking meds as directed w/o problems.  Denies medication side effects.    Taking statin without problems   Past Medical History:  Diagnosis Date   AAA (abdominal aortic aneurysm) ([place])    Hepatitis A 1998   with elevated LFT's   Hyperlipidemia    Hypertension    IFG (impaired fasting glucose)     Past Surgical History:  Procedure Laterality Date   arthroscopic surgery  1982   left knee    Family History  Problem Relation Age of Onset   Hypertension Mother    Hyperlipidemia Mother    Heart disease Father    Hyperlipidemia Father    Hypertension Father    Hypertension Sister    Hyperlipidemia Sister    Hyperlipidemia Brother    Hypertension Brother    Heart disease Brother     Social History   Socioeconomic History   Marital status: Married    Spouse name: Santiago Glad   Number of children: 2   Years of education: 18   Highest education level: Master's degree (e.g., MA, MS, MEng, MEd, MSW, MBA)  Occupational History   Occupation: Teacher, 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  No caffeine.     Social Determinants of Health   Financial Resource Strain: Low Risk    Difficulty of Paying Living  Expenses: Not hard at all  Food Insecurity: No Food Insecurity   Worried About Charity fundraiser in the Last Year: Never true   [place] in the Last Year: Never true  Transportation Needs: No Transportation Needs   Lack of Transportation (Medical): No   Lack of Transportation (Non-Medical): No  Physical Activity: Sufficiently Active   Days of Exercise per Week: 7 days   Minutes of Exercise per Session: 90 min  Stress: No Stress Concern Present   Feeling of Stress : Not at all  Social Connections: Moderately Isolated   Frequency of Communication with Friends and Family: More than three times a week   Frequency of Social Gatherings with Friends and Family: More than three times a week   Attends Religious Services: Never   Marine scientist or Organizations: No   Attends Music therapist: Never   Marital Status: Married  Human resources officer Violence: Not At Risk   Fear of Current or Ex-Partner: No   Emotionally Abused: No   Physically Abused: No   Sexually Abused: No    Outpatient Medications Prior to Visit  Medication Sig Dispense Refill   atorvastatin (LIPITOR) 40 MG tablet  Take 1 tablet (40 mg total) by mouth daily. 90 tablet 3   ipratropium (ATROVENT) 0.06 % nasal spray Place 1-2 sprays into both nostrils 2 (two) times daily as needed for rhinitis. 15 mL 3   lisinopril (ZESTRIL) 5 MG tablet Take 1 tablet (5 mg total) by mouth daily. 90 tablet 3   clobetasol ointment (TEMOVATE) 6.57 % Apply 1 application topically 2 (two) times daily. To affected area(s) as needed, max 2 weeks to avoid whitening/thinning skin 30 g 1   pantoprazole (PROTONIX) 40 MG tablet Take 1 tablet (40 mg total) by mouth at bedtime. 30 tablet 1   No facility-administered medications prior to visit.    No Known Allergies  ROS Review of Systems    Objective:    Physical Exam Constitutional:      Appearance: He is well-developed.  HENT:     Head: Normocephalic and atraumatic.   Cardiovascular:     Rate and Rhythm: Normal rate and regular rhythm.     Heart sounds: Normal heart sounds.  Pulmonary:     Effort: Pulmonary effort is normal.     Breath sounds: Normal breath sounds.  Skin:    General: Skin is warm and dry.  Neurological:     Mental Status: He is alert and oriented to person, place, and time.  Psychiatric:        Behavior: Behavior normal.    BP 119/66   Pulse 66   Ht 5\' 6"  (1.676 m)   Wt 158 lb (71.7 kg)   SpO2 99%   BMI 25.50 kg/m  Wt Readings from Last 3 Encounters:  10/05/20 158 lb (71.7 kg)  05/20/20 157 lb (71.2 kg)  04/07/20 155 lb (70.3 kg)     Health Maintenance Due  Topic Date Due   Zoster Vaccines- Shingrix (1 of 2) Never done   COVID-19 Vaccine (4 - Booster for Moderna series) 05/01/2020    There are no preventive care reminders to display for this patient.  No results found for: TSH Lab Results  Component Value Date   WBC 6.5 05/20/2020   HGB 14.5 05/20/2020   HCT 43.2 05/20/2020   MCV 98.4 05/20/2020   PLT 207 05/20/2020   Lab Results  Component Value Date   NA 140 05/20/2020   K 4.3 05/20/2020   CO2 26 05/20/2020   GLUCOSE 93 05/20/2020   BUN 24 05/20/2020   CREATININE 0.94 05/20/2020   BILITOT 0.7 05/20/2020   ALKPHOS 51 04/12/2016   AST 44 (H) 05/20/2020   ALT 16 05/20/2020   PROT 7.0 05/20/2020   ALBUMIN 4.3 04/12/2016   CALCIUM 9.1 05/20/2020   Lab Results  Component Value Date   CHOL 121 04/07/2020   Lab Results  Component Value Date   HDL 47 04/07/2020   Lab Results  Component Value Date   LDLCALC 60 04/07/2020   Lab Results  Component Value Date   TRIG 61 04/07/2020   Lab Results  Component Value Date   CHOLHDL 2.6 04/07/2020   Lab Results  Component Value Date   HGBA1C 5.2 04/07/2020      Assessment & Plan:   Problem List Items Addressed This Visit       Cardiovascular and Mediastinum   HYPERTENSION, BENIGN ESSENTIAL - Primary    Well controlled. Continue current  regimen. Follow up in  6 mo        Relevant Orders   COMPLETE METABOLIC PANEL WITH GFR   Abdominal aortic ectasia ([facility])  Measures 2.9 cm on ultrasound performed July 2020.  Recommendation is for repeat in 5 years which would be July 2025.         Endocrine   IFG (impaired fasting glucose)    Repeat in 6 mo          Other   Hyperlipemia    Plan to recheck in 6 o         No orders of the defined types were placed in this encounter.   Follow-up: Return in about 6 months (around 04/07/2021) for Hypertension and fasting labs. Beatrice Lecher, MD

## 2020-10-05 NOTE — Assessment & Plan Note (Signed)
Measures 2.9 cm on ultrasound performed July 2020.  Recommendation is for repeat in 5 years which would be July 2025.

## 2020-10-05 NOTE — Assessment & Plan Note (Signed)
Repeat in 6 mo

## 2020-10-06 LAB — COMPLETE METABOLIC PANEL WITH GFR
AG Ratio: 1.6 (calc) (ref 1.0–2.5)
ALT: 16 U/L (ref 9–46)
AST: 44 U/L — ABNORMAL HIGH (ref 10–35)
Albumin: 4.4 g/dL (ref 3.6–5.1)
Alkaline phosphatase (APISO): 73 U/L (ref 35–144)
BUN: 17 mg/dL (ref 7–25)
CO2: 30 mmol/L (ref 20–32)
Calcium: 9.7 mg/dL (ref 8.6–10.3)
Chloride: 105 mmol/L (ref 98–110)
Creat: 0.98 mg/dL (ref 0.70–1.18)
GFR, Est African American: 87 mL/min/{1.73_m2} (ref >=60)
GFR, Est Non African American: 75 mL/min/{1.73_m2} (ref >=60)
Globulin: 2.7 g/dL (calc) (ref 1.9–3.7)
Glucose, Bld: 122 mg/dL (ref 65–139)
Potassium: 3.8 mmol/L (ref 3.5–5.3)
Sodium: 141 mmol/L (ref 135–146)
Total Bilirubin: 1 mg/dL (ref 0.2–1.2)
Total Protein: 7.1 g/dL (ref 6.1–8.1)

## 2020-10-12 IMAGING — US ULTRASOUND AORTA
1 series · 14 of 25 positions shown · non-contrast
Comparison: Ultrasound July 02, 2012.

CLINICAL DATA: Abdominal aortic ectasia.

EXAM:
ULTRASOUND OF ABDOMINAL AORTA
TECHNIQUE: Ultrasound examination of the abdominal aorta and proximal common
iliac arteries was performed to evaluate for aneurysm. Additional
color and Doppler images of the distal aorta were obtained to
document patency.

[Series 1: ultrasound aorta · 0.22mm/px · 14 of 59 slices shown]
[im 1/59]
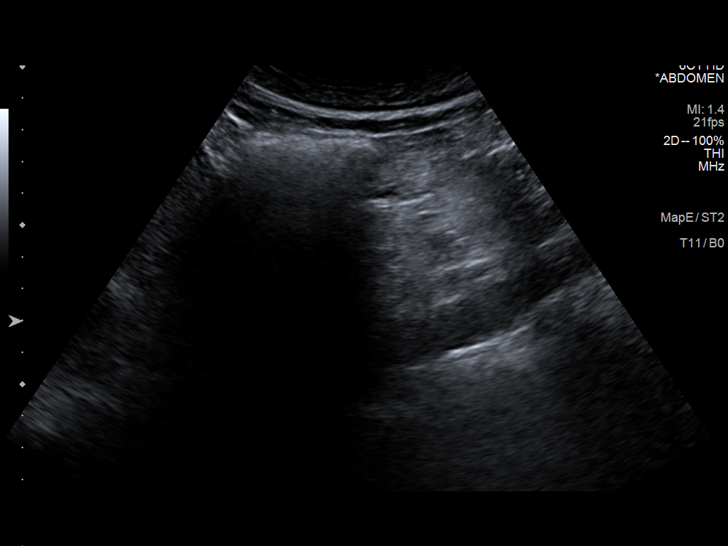
[im 5/59]
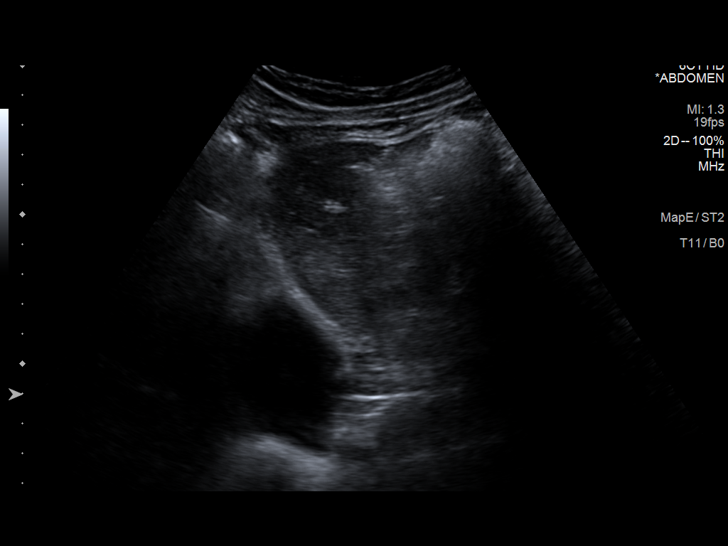
[im 10/59]
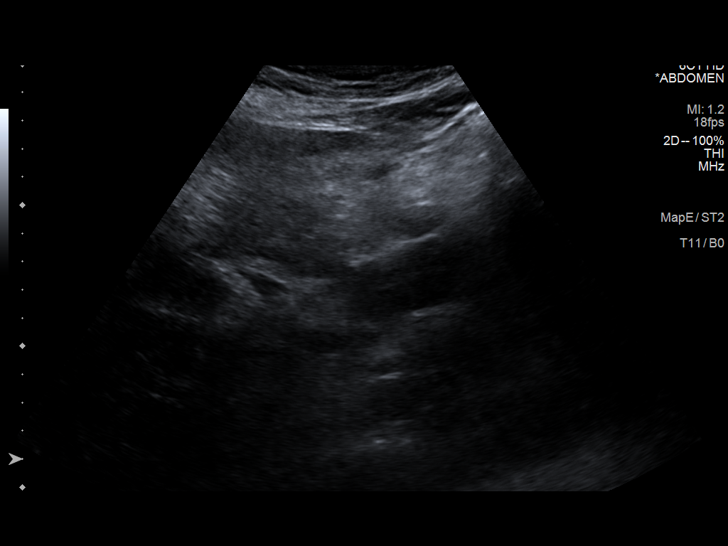
[im 15/59]
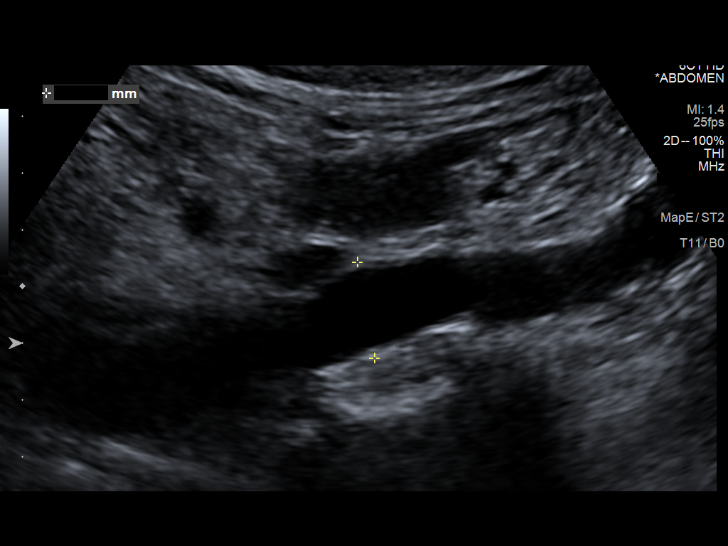
[im 20/59]
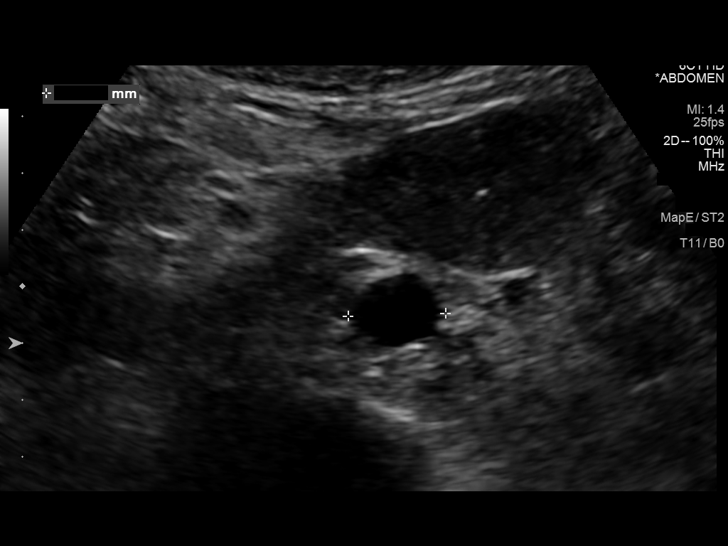
[im 22/59]
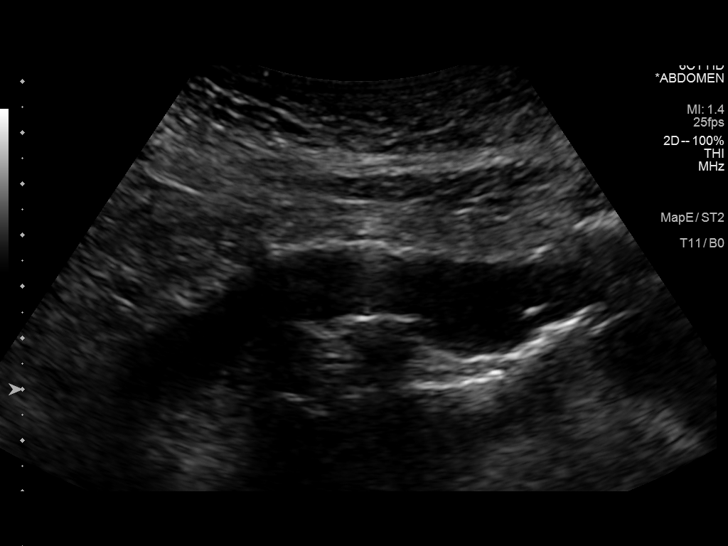
[im 27/59]
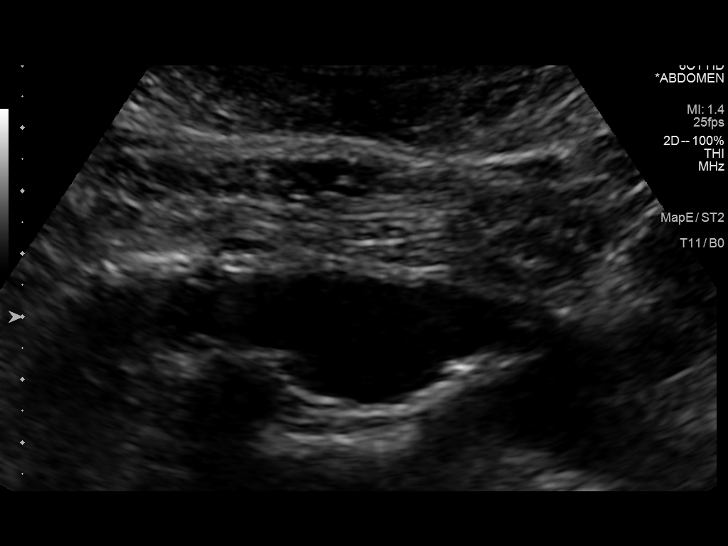
[im 32/59]
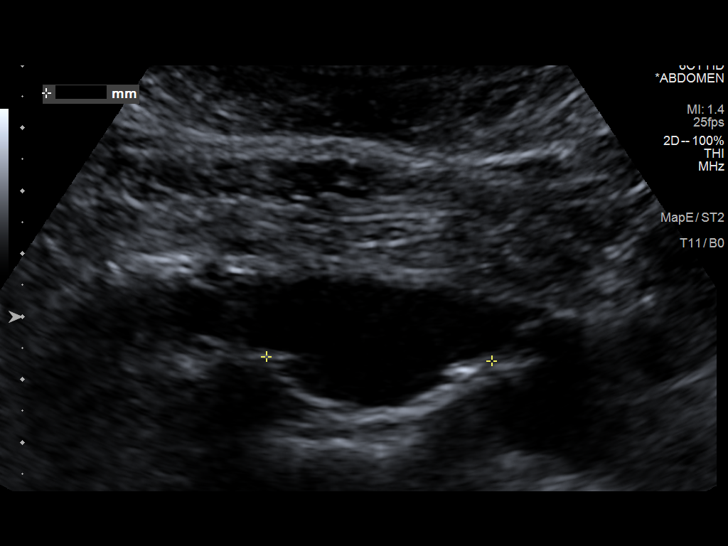
[im 37/59]
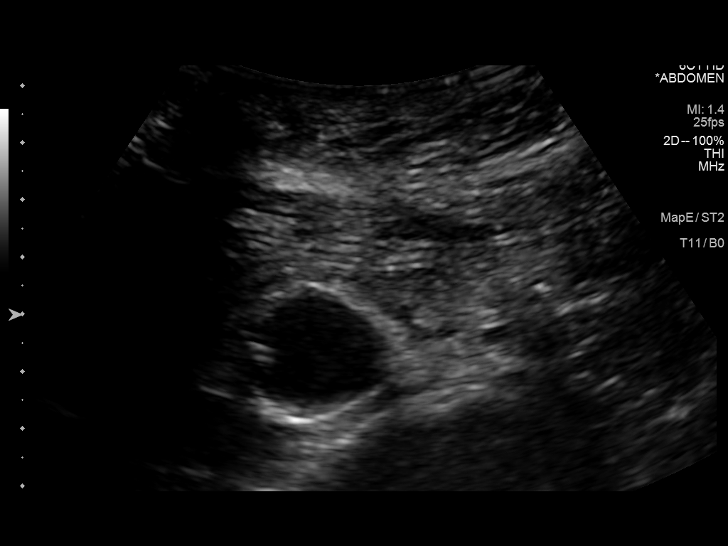
[im 39/59]
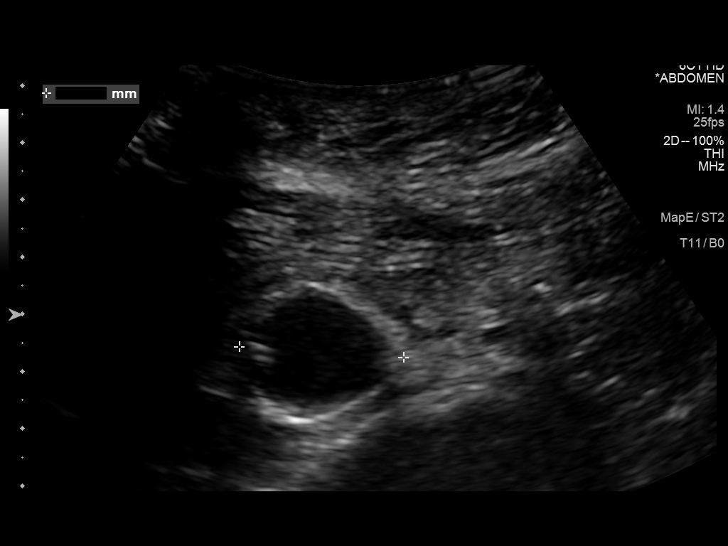
[im 44/59]
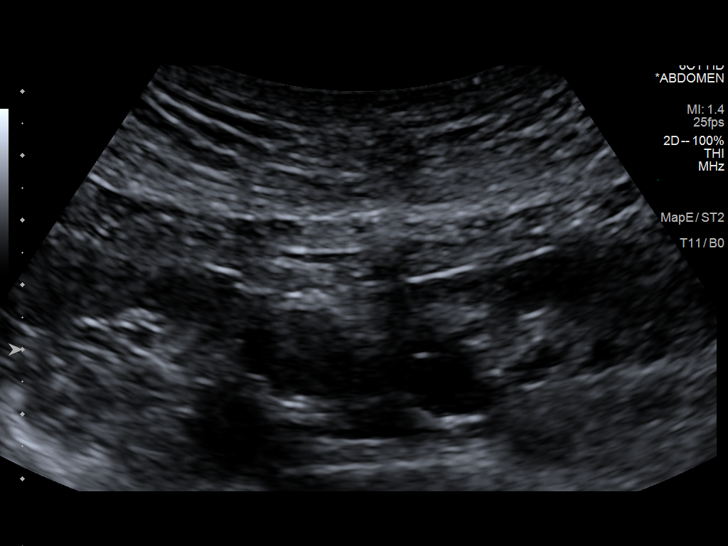
[im 49/59]
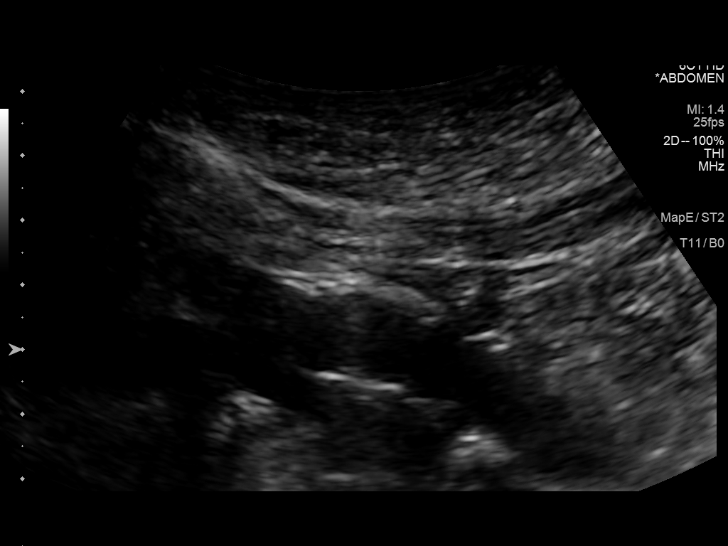
[im 54/59]
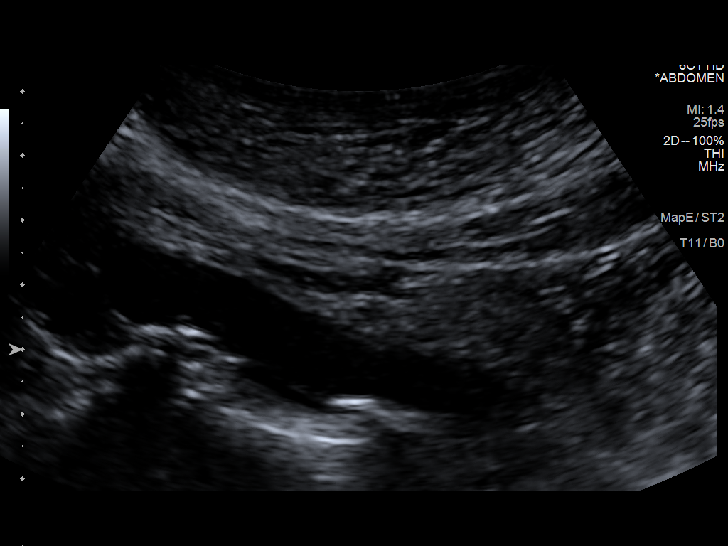
[im 59/59]
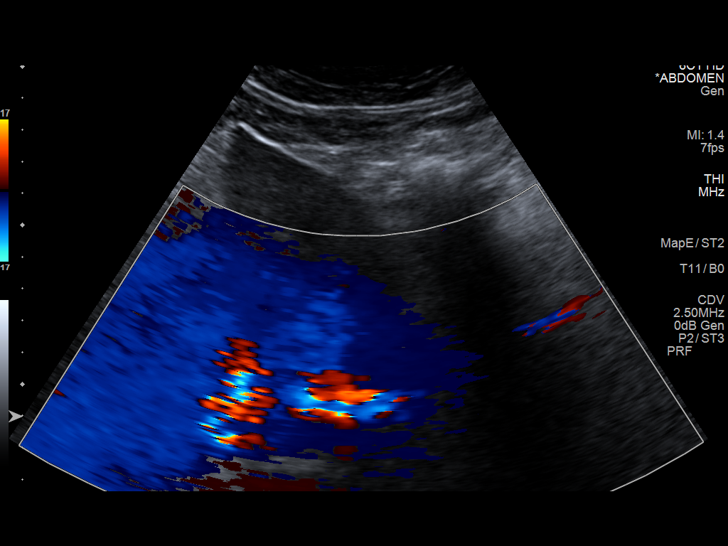

[14 of 25 positions shown; findings below may reference images not displayed]

FINDINGS: Abdominal aortic measurements as follows:

Proximal:  2.2 cm

Mid:  1.7 cm

Distal:  2.9 cm
Patent: Yes, peak systolic velocity is 137 cm/s

Right common iliac artery: 1.9 cm

Left common iliac artery: 1.7 cm
IMPRESSION: Maximum measured diameter of 2.9 cm. Ectatic abdominal aorta at risk
for aneurysm development. Recommend followup by ultrasound in 5
years. This recommendation follows ACR consensus guidelines: White
Paper of the ACR Incidental Findings Committee II on Vascular
Findings. [HOSPITAL] 0833; [DATE].

Aortic aneurysm NOS ([id number])

## 2020-10-13 ENCOUNTER — Telehealth: Payer: Self-pay

## 2020-10-13 NOTE — Chronic Care Management (AMB) (Signed)
  Chronic Care Management   Outreach Note  10/13/2020 Name: Brad Cannon MRN: [id number] DOB: 1946/01/25  Brad Cannon is a 75 y.o. year old male who is a primary care patient of Madilyn Fireman, Rene Kocher, MD. I reached out to Brad Cannon by phone today in response to a referral sent by Mr. Sandro Burgo Spieler's PCP, Hali Marry, MD      An unsuccessful telephone outreach was attempted today. The patient was referred to the case management team for assistance with care management and care coordination.   Follow Up Plan: A HIPAA compliant phone message was left for the patient providing contact information and requesting a return call.  The care management team will reach out to the patient again over the next 7 days.  If patient returns call to provider office, please advise to call [facility] at [place], [place], [place], [place] [address] Direct Dial: [phone] [email or web address]: [email or web address]

## 2020-10-20 NOTE — Chronic Care Management (AMB) (Signed)
  Chronic Care Management   Outreach Note  10/20/2020 Name: Brad Cannon MRN: [id number] DOB: 15-Aug-1945  Brad Cannon is a 75 y.o. year old male who is a primary care patient of Madilyn Fireman, Rene Kocher, MD. I reached out to Brad Cannon by phone today in response to a referral sent by Mr. Jarelle Ates Blakely's PCP, Hali Marry, MD      A second unsuccessful telephone outreach was attempted today. The patient was referred to the case management team for assistance with care management and care coordination.   Follow Up Plan: A HIPAA compliant phone message was left for the patient providing contact information and requesting a return call.  The care management team will reach out to the patient again over the next 7 days.  If patient returns call to provider office, please advise to call [facility] at [place], [place], [place], [place] [address] Direct Dial: [phone] [email or web address]: [email or web address]

## 2020-10-29 NOTE — Chronic Care Management (AMB) (Signed)
  Chronic Care Management   Outreach Note  10/29/2020 Name: EVRETT HOMMERDING MRN: [id number] DOB: 1946/02/11  [name] is a 75 y.o. year old male who is a primary care patient of Madilyn Fireman, Rene Kocher, MD. I reached out to [name] by phone today in response to a referral sent by Mr. Quirino Renna Heroux's PCP, Hali Marry, MD      Third unsuccessful telephone outreach was attempted today.    Follow Up Plan: The care management team will reach out to the patient again over the next 7 days.  If patient returns call to provider office, please advise to call [facility]  at [phone]  [facility]

## 2020-11-30 ENCOUNTER — Ambulatory Visit (INDEPENDENT_AMBULATORY_CARE_PROVIDER_SITE_OTHER): Payer: Medicare HMO | Admitting: Family Medicine

## 2020-11-30 ENCOUNTER — Other Ambulatory Visit: Payer: Self-pay

## 2020-11-30 DIAGNOSIS — Z23 Encounter for immunization: Secondary | ICD-10-CM | POA: Diagnosis not present

## 2020-12-02 NOTE — Chronic Care Management (AMB) (Signed)
  Chronic Care Management   Note  12/02/2020 Name: CAMBREN HELM MRN: [id number] DOB: 1945/08/24  [name] is a 75 y.o. year old male who is a primary care patient of [place], Rene Kocher, MD. I reached out to [name] by phone today in response to a referral sent by Mr. Angad Nabers Umeda's PCP, Hali Marry, MD      Mr. Saulter was given information about Chronic Care Management services today including:  CCM service includes personalized support from designated clinical staff supervised by his physician, including individualized plan of care and coordination with other care providers 24/7 contact phone numbers for assistance for urgent and routine care needs. Service will only be billed when office clinical staff spend 20 minutes or more in a month to coordinate care. Only one practitioner may furnish and bill the service in a calendar month. The patient may stop CCM services at any time (effective at the end of the month) by phone call to the office staff. The patient will be responsible for cost sharing (co-pay) of up to 20% of the service fee (after annual deductible is met).  Patient did not agree to enrollment in care management services and does not wish to consider at this time.  Follow up plan: Patient declines  engagement by the care management team. Appropriate care team members and provider have been notified via electronic communication.   Noreene Larsson, [address], [place], [place] [address] Direct Dial: [phone] [email or web address]: [email or web address]

## 2021-02-01 ENCOUNTER — Ambulatory Visit (INDEPENDENT_AMBULATORY_CARE_PROVIDER_SITE_OTHER): Payer: Medicare HMO | Admitting: Family Medicine

## 2021-02-01 ENCOUNTER — Encounter: Payer: Self-pay | Admitting: Family Medicine

## 2021-02-01 ENCOUNTER — Other Ambulatory Visit: Payer: Self-pay

## 2021-02-01 VITALS — BP 123/76 | HR 65 | Temp 97.7°F | Wt 157.0 lb

## 2021-02-01 DIAGNOSIS — H60331 Swimmer's ear, right ear: Secondary | ICD-10-CM | POA: Diagnosis not present

## 2021-02-01 MED ORDER — OFLOXACIN 0.3 % OT SOLN: 10.0000 [drp] | Freq: Every day | OTIC | 0 refills | Status: DC

## 2021-02-01 NOTE — Patient Instructions (Signed)
Sending in antibiotic ear drops

## 2021-02-01 NOTE — Progress Notes (Signed)
Acute Office Visit  Subjective:    Patient ID: Brad Cannon, male    DOB: Oct 12, 1945, 75 y.o.   MRN: [id number]  Chief Complaint  Patient presents with   Otalgia    Right     Otalgia   Patient is in today for right ear pain.  About 3 days ago, patient started to notice some right ear pain, mostly in his canal. States he has no external pain to pinna or lymph nodes, but the canal is tender to any palpation, no pain at rest. He denies any drainage, fevers, hearing loss, malaise.   He swims three days a week, but tries to always wear ear plugs although he still thinks some water gets in and occasionally one will fall out.     Past Medical History:  Diagnosis Date   AAA (abdominal aortic aneurysm)    Hepatitis A 1998   with elevated LFT's   Hyperlipidemia    Hypertension    IFG (impaired fasting glucose)     Past Surgical History:  Procedure Laterality Date   arthroscopic surgery  1982   left knee    Family History  Problem Relation Age of Onset   Hypertension Mother    Hyperlipidemia Mother    Heart disease Father    Hyperlipidemia Father    Hypertension Father    Hypertension Sister    Hyperlipidemia Sister    Hyperlipidemia Brother    Hypertension Brother    Heart disease Brother     Social History   Socioeconomic History   Marital status: Married    Spouse name: Santiago Glad   Number of children: 2   Years of education: 18   Highest education level: Master's degree (e.g., MA, MS, MEng, MEd, MSW, MBA)  Occupational History   Occupation: Teacher, 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  No  caffeine.     Social Determinants of Health   Financial Resource Strain: Not on file  Food Insecurity: Not on file  Transportation Needs: Not on file  Physical Activity: Not on file  Stress: Not on file  Social Connections: Not on file  Intimate Partner Violence: Not on file    Outpatient Medications Prior to Visit  Medication Sig Dispense Refill   atorvastatin (LIPITOR) 40 MG tablet Take 1 tablet (40 mg total) by mouth daily. 90 tablet 3   ipratropium (ATROVENT) 0.06 % nasal spray Place 1-2 sprays into both nostrils 2 (two) times daily as needed for rhinitis. 15 mL 3   lisinopril (ZESTRIL) 5 MG tablet Take 1 tablet (5 mg total) by mouth daily. 90 tablet 3   No facility-administered medications prior to visit.    No Known Allergies  Review of Systems All review of systems negative except what is listed in the HPI     Objective:    Physical Exam Vitals reviewed.  Constitutional:      Appearance: Normal appearance.  HENT:     Head: Normocephalic and atraumatic.     Right Ear: Swelling present.  Tympanic membrane is erythematous.     Left Ear: Tympanic membrane normal.     Ears:     Comments: R canal with mild swelling and erythema, very tender     Mouth/Throat:     Mouth: Mucous membranes are moist.     Pharynx: Oropharynx is clear.  Cardiovascular:     Rate and Rhythm: Normal rate and regular rhythm.  Pulmonary:     Effort: Pulmonary effort is normal.     Breath sounds: Normal breath sounds.  Musculoskeletal:     Cervical back: Normal range of motion and neck supple.  Skin:    General: Skin is warm and dry.  Neurological:     Mental Status: He is alert and oriented to person, place, and time.  Psychiatric:        Mood and Affect: Mood normal.        Behavior: Behavior normal.        Thought Content: Thought content normal.        Judgment: Judgment normal.    BP 123/76 (BP Location: Left Arm, Patient Position: Sitting, Cuff Size: Normal)   Pulse 65   Temp  97.7 F (36.5 C) (Oral)   Wt 157 lb 0.6 oz (71.2 kg)   BMI 25.35 kg/m  Wt Readings from Last 3 Encounters:  02/01/21 157 lb 0.6 oz (71.2 kg)  10/05/20 158 lb (71.7 kg)  05/20/20 157 lb (71.2 kg)    Health Maintenance Due  Topic Date Due   Zoster Vaccines- Shingrix (1 of 2) Never done   COVID-19 Vaccine (4 - Booster for Moderna series) 03/26/2020    There are no preventive care reminders to display for this patient.   No results found for: TSH Lab Results  Component Value Date   WBC 6.5 05/20/2020   HGB 14.5 05/20/2020   HCT 43.2 05/20/2020   MCV 98.4 05/20/2020   PLT 207 05/20/2020   Lab Results  Component Value Date   NA 141 10/05/2020   K 3.8 10/05/2020   CO2 30 10/05/2020   GLUCOSE 122 10/05/2020   BUN 17 10/05/2020   CREATININE 0.98 10/05/2020   BILITOT 1.0 10/05/2020   ALKPHOS 51 04/12/2016   AST 44 (H) 10/05/2020   ALT 16 10/05/2020   PROT 7.1 10/05/2020   ALBUMIN 4.3 04/12/2016   CALCIUM 9.7 10/05/2020   Lab Results  Component Value Date   CHOL 121 04/07/2020   Lab Results  Component Value Date   HDL 47 04/07/2020   Lab Results  Component Value Date   LDLCALC 60 04/07/2020   Lab Results  Component Value Date   TRIG 61 04/07/2020   Lab Results  Component Value Date   CHOLHDL 2.6 04/07/2020   Lab Results  Component Value Date   HGBA1C 5.2 04/07/2020       Assessment & Plan:   1. Acute swimmer's ear of right side Treating with drops. Recommend that he keep the ear clean and dry until pain resolves. Can use ear plugs as long as they are cleaned and avoid putting qtips into the air. Follow-up if not improving. Patient aware of signs/symptoms requiring further/urgent evaluation.   - ofloxacin (FLOXIN) 0.3 % OTIC solution; Place 10 drops into the right ear daily. For 7 days.  Dispense: 10 mL; Refill: 0  Follow-up if symptoms worsen or fail to improve. Please contact office for sooner follow-up if symptoms do not improve or worsen. Seek  emergency care if symptoms become severe.  Purcell Nails Olevia Bowens, DNP, FNP-C

## 2021-02-08 ENCOUNTER — Encounter: Payer: Self-pay | Admitting: Family Medicine

## 2021-02-08 ENCOUNTER — Telehealth (INDEPENDENT_AMBULATORY_CARE_PROVIDER_SITE_OTHER): Payer: Medicare HMO | Admitting: Family Medicine

## 2021-02-08 DIAGNOSIS — U071 COVID-19: Secondary | ICD-10-CM

## 2021-02-08 NOTE — Progress Notes (Signed)
Pt tested Positvie this morning for covid.   He has a cough,sore throat that comes and goes, some chills,headache between his eyes. He reports that his sxs began on Saturday.   He has been taking tylenol Arthritis. During the day drinking warm water to help with the sore throat and throat lozenges.

## 2021-02-08 NOTE — Progress Notes (Signed)
Virtual Visit via Telephone Note  I connected with Brad Cannon on 02/08/21 at  3:40 PM EST by telephone and verified that I am speaking with the correct person using two identifiers.   I discussed the limitations, risks, security and privacy concerns of performing an evaluation and management service by telephone and the availability of in person appointments. I also discussed with the patient that there may be a patient responsible charge related to this service. The patient expressed understanding and agreed to proceed.  Patient location: at home  Provider loccation: In office   Subjective:    CC: COVID -19   HPI: Pt tested Positvie this morning for covid. Sxs started Sat afternoon. He has a productive cough, sore throat that comes and goes, some chills, headache between his eyes. He has been taking tylenol Arthritis. During the day drinking warm water to help with the sore throat and throat lozenges. No SOB. Had a fever on Saturday.  No GI sxs.     Past medical history, Surgical history, Family history not pertinant except as noted below, Social history, Allergies, and medications have been entered into the medical record, reviewed, and corrections made.   Review of Systems: No fevers, chills, night sweats, weight loss, chest pain, or shortness of breath.   Objective:    General: Speaking clearly in complete sentences without any shortness of breath.  Alert and oriented x3.  Normal judgment. No apparent acute distress.    Impression and Recommendations:    COVID 19 . Discussed symptoms and symptomatic care. Discussed OTC medication.  Discussed pros and cons of Paxlovid.  Call if new or worsening sxs.     I discussed the assessment and treatment plan with the patient. The patient was provided an opportunity to ask questions and all were answered. The patient agreed with the plan and demonstrated an understanding of the instructions.   The patient was advised to call back  or seek an in-person evaluation if the symptoms worsen or if the condition fails to improve as anticipated.  I provided 15 minutes of non-face-to-face time during this encounter.   Beatrice Lecher, MD

## 2021-03-25 ENCOUNTER — Other Ambulatory Visit: Payer: Self-pay | Admitting: Family Medicine

## 2021-04-07 ENCOUNTER — Encounter: Payer: Self-pay | Admitting: Family Medicine

## 2021-04-07 ENCOUNTER — Other Ambulatory Visit: Payer: Self-pay

## 2021-04-07 ENCOUNTER — Ambulatory Visit (INDEPENDENT_AMBULATORY_CARE_PROVIDER_SITE_OTHER): Payer: Medicare HMO | Admitting: Family Medicine

## 2021-04-07 VITALS — BP 116/68 | HR 63 | Resp 18 | Ht 66.0 in | Wt 159.0 lb

## 2021-04-07 DIAGNOSIS — I10 Essential (primary) hypertension: Secondary | ICD-10-CM | POA: Diagnosis not present

## 2021-04-07 DIAGNOSIS — R972 Elevated prostate specific antigen [PSA]: Secondary | ICD-10-CM

## 2021-04-07 DIAGNOSIS — M79644 Pain in right finger(s): Secondary | ICD-10-CM

## 2021-04-07 DIAGNOSIS — R7301 Impaired fasting glucose: Secondary | ICD-10-CM

## 2021-04-07 DIAGNOSIS — E785 Hyperlipidemia, unspecified: Secondary | ICD-10-CM | POA: Diagnosis not present

## 2021-04-07 MED ORDER — LISINOPRIL 5 MG PO TABS: 5.0000 mg | ORAL_TABLET | Freq: Every day | ORAL | 3 refills | Status: DC

## 2021-04-07 NOTE — Assessment & Plan Note (Signed)
Well controlled. Continue current regimen. Follow up in  6 mo  

## 2021-04-07 NOTE — Assessment & Plan Note (Signed)
-   Repeat PSA today

## 2021-04-07 NOTE — Assessment & Plan Note (Addendum)
Due to recheck lipids.  Continue atorvastatin.

## 2021-04-07 NOTE — Progress Notes (Signed)
Established Patient Office Visit  Subjective:  Patient ID: Brad Cannon, male    DOB: 03/20/1946  Age: 76 y.o. MRN: [id number]  CC:  Chief Complaint  Patient presents with   Hypertension    Follow up    Finger Concern    Right Middle Finger, swollen/discolored since 12/2020    HPI TEVITA GOMER presents for   Hypertension- Pt denies chest pain, SOB, dizziness, or heart palpitations.  Taking meds as directed w/o problems.  Denies medication side effects.     Impaired fasting glucose-no increased thirst or urination. No symptoms consistent with hypoglycemia.   He also wanted me to look at his right middle finger.  He says it got injured on a dogstrap probably over 3 months ago.  He just remained a little bit sore and tender and a little bit puffy.  He is never had any drainage from it.  Worsening symptoms.  He says when the injury first occurred he thought maybe he had broken it but he is pretty sure he did not.  Past Medical History:  Diagnosis Date   AAA (abdominal aortic aneurysm)    Hepatitis A 1998   with elevated LFT's   Hyperlipidemia    Hypertension    IFG (impaired fasting glucose)     Past Surgical History:  Procedure Laterality Date   arthroscopic surgery  1982   left knee    Family History  Problem Relation Age of Onset   Hypertension Mother    Hyperlipidemia Mother    Heart disease Father    Hyperlipidemia Father    Hypertension Father    Hypertension Sister    Hyperlipidemia Sister    Hyperlipidemia Brother    Hypertension Brother    Heart disease Brother     Social History   Socioeconomic History   Marital status: Married    Spouse name: Santiago Glad   Number of children: 2   Years of education: 18   Highest education level: Master's degree (e.g., MA, MS, MEng, MEd, MSW, MBA)  Occupational History   Occupation: Teacher, 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  No caffeine.     Social Determinants of Health   Financial Resource Strain: Not on file  Food Insecurity: Not on file  Transportation Needs: Not on file  Physical Activity: Not on file  Stress: Not on file  Social Connections: Not on file  Intimate Partner Violence: Not on file    Outpatient Medications Prior to Visit  Medication Sig Dispense Refill   atorvastatin (LIPITOR) 40 MG tablet TAKE 1 TABLET EVERY DAY 90 tablet 0   ipratropium (ATROVENT) 0.06 % nasal spray Place 1-2 sprays into both nostrils 2 (two) times daily as needed for rhinitis. 15 mL 3   lisinopril (ZESTRIL) 5 MG tablet Take 1 tablet (5 mg total) by mouth daily. 90 tablet 3   ofloxacin (FLOXIN) 0.3 % OTIC solution Place 10 drops into the right ear daily. For 7 days. 10 mL 0   No facility-administered medications  prior to visit.    No Known Allergies  ROS Review of Systems    Objective:    Physical Exam  BP 116/68    Pulse 63    Resp 18    Ht 5\' 6"  (1.676 m)    Wt 159 lb (72.1 kg)    SpO2 99%    BMI 25.66 kg/m  Wt Readings from Last 3 Encounters:  04/07/21 159 lb (72.1 kg)  02/01/21 157 lb 0.6 oz (71.2 kg)  10/05/20 158 lb (71.7 kg)     Health Maintenance Due  Topic Date Due   COVID-19 Vaccine (4 - Booster for Moderna series) 03/26/2020   Zoster Vaccines- Shingrix (2 of 2) 03/14/2021    There are no preventive care reminders to display for this patient.  No results found for: TSH Lab Results  Component Value Date   WBC 6.5 05/20/2020   HGB 14.5 05/20/2020   HCT 43.2 05/20/2020   MCV 98.4 05/20/2020   PLT 207 05/20/2020   Lab Results  Component Value Date   NA 141 10/05/2020   K 3.8 10/05/2020    CO2 30 10/05/2020   GLUCOSE 122 10/05/2020   BUN 17 10/05/2020   CREATININE 0.98 10/05/2020   BILITOT 1.0 10/05/2020   ALKPHOS 51 04/12/2016   AST 44 (H) 10/05/2020   ALT 16 10/05/2020   PROT 7.1 10/05/2020   ALBUMIN 4.3 04/12/2016   CALCIUM 9.7 10/05/2020   Lab Results  Component Value Date   CHOL 121 04/07/2020   Lab Results  Component Value Date   HDL 47 04/07/2020   Lab Results  Component Value Date   LDLCALC 60 04/07/2020   Lab Results  Component Value Date   TRIG 61 04/07/2020   Lab Results  Component Value Date   CHOLHDL 2.6 04/07/2020   Lab Results  Component Value Date   HGBA1C 5.2 04/07/2020      Assessment & Plan:   Problem List Items Addressed This Visit       Cardiovascular and Mediastinum   HYPERTENSION, BENIGN ESSENTIAL    Well controlled. Continue current regimen. Follow up in  56mo       Relevant Medications   lisinopril (ZESTRIL) 5 MG tablet   Other Relevant Orders   COMPLETE METABOLIC PANEL WITH GFR   Lipid Panel w/reflex Direct LDL     Endocrine   IFG (impaired fasting glucose)    Well controlled. Continue current regimen. Follow up in  6 mo       Relevant Orders   COMPLETE METABOLIC PANEL WITH GFR   Lipid Panel w/reflex Direct LDL   HgB A1c     Other   Hyperlipemia - Primary    Due to recheck lipids.  Continue atorvastatin.      Relevant Medications   lisinopril (ZESTRIL) 5 MG tablet   Other Relevant Orders   COMPLETE METABOLIC PANEL WITH GFR   Lipid Panel w/reflex Direct LDL   ELEVATED PROSTATE SPECIFIC ANTIGEN    Repeat PSA today.      Relevant Orders   PSA   Other Visit Diagnoses     Pain of right middle finger           Pain of right middle finger  - he does have a little bit of swelling at the nailbed.  A little bit puffy not significantly tender.  I do not see any indication of infection is active.  It could be that he traumatized the nailbed during the  injury.  We will keep an eye on it and let me know  if it continues to be painful or tender.  Meds ordered this encounter  Medications   lisinopril (ZESTRIL) 5 MG tablet    Sig: Take 1 tablet (5 mg total) by mouth daily.    Dispense:  90 tablet    Refill:  3    Follow-up: Return in about 6 months (around 10/05/2021) for Hypertension.    Beatrice Lecher, MD

## 2021-04-08 LAB — COMPLETE METABOLIC PANEL WITH GFR
AG Ratio: 1.8 (calc) (ref 1.0–2.5)
ALT: 17 U/L (ref 9–46)
AST: 44 U/L — ABNORMAL HIGH (ref 10–35)
Albumin: 4.4 g/dL (ref 3.6–5.1)
Alkaline phosphatase (APISO): 73 U/L (ref 35–144)
BUN: 17 mg/dL (ref 7–25)
CO2: 27 mmol/L (ref 20–32)
Calcium: 9.2 mg/dL (ref 8.6–10.3)
Chloride: 106 mmol/L (ref 98–110)
Creat: 0.88 mg/dL (ref 0.70–1.28)
Globulin: 2.5 g/dL (calc) (ref 1.9–3.7)
Glucose, Bld: 97 mg/dL (ref 65–99)
Potassium: 4.6 mmol/L (ref 3.5–5.3)
Sodium: 139 mmol/L (ref 135–146)
Total Bilirubin: 0.8 mg/dL (ref 0.2–1.2)
Total Protein: 6.9 g/dL (ref 6.1–8.1)
eGFR: 89 mL/min/{1.73_m2} (ref >=60)

## 2021-04-08 LAB — LIPID PANEL W/REFLEX DIRECT LDL
Cholesterol: 113 mg/dL (ref <200)
HDL: 45 mg/dL (ref >=40)
LDL Cholesterol (Calc): 55 mg/dL (calc)
Non-HDL Cholesterol (Calc): 68 mg/dL (calc) (ref <130)
Total CHOL/HDL Ratio: 2.5 (calc) (ref <5.0)
Triglycerides: 52 mg/dL (ref <150)

## 2021-04-08 LAB — HEMOGLOBIN A1C
Hgb A1c MFr Bld: 5.5 % of total Hgb (ref <5.7)
Mean Plasma Glucose: 111 mg/dL
eAG (mmol/L): 6.2 mmol/L

## 2021-04-08 LAB — PSA: PSA: 1.88 ng/mL (ref <=4.00)

## 2021-04-11 NOTE — Progress Notes (Signed)
Hi Ron, your metabolic panel looks good.  Liver enzyme is stable.  Cholesterol is at goal. Prostate test is normal. A1C looks great!

## 2021-05-16 ENCOUNTER — Ambulatory Visit (INDEPENDENT_AMBULATORY_CARE_PROVIDER_SITE_OTHER): Payer: Medicare HMO | Admitting: Family Medicine

## 2021-05-16 DIAGNOSIS — Z0000 Encounter for general adult medical examination without abnormal findings: Secondary | ICD-10-CM

## 2021-05-16 NOTE — Patient Instructions (Addendum)
[place] Maintenance Summary and Written Plan of Care  [name] ,  Thank you for allowing me to perform your Medicare Annual Wellness Visit and for your ongoing commitment to your health.   Health Maintenance & Immunization History Health Maintenance  Topic Date Due   COVID-19 Vaccine (4 - Booster for Moderna series) 06/01/2021 (Originally 03/26/2020)   TETANUS/TDAP  04/18/2026   Pneumonia Vaccine 45+ Years old  Completed   INFLUENZA VACCINE  Completed   Hepatitis C Screening  Completed   Zoster Vaccines- Shingrix  Completed   HPV VACCINES  Aged Out   COLONOSCOPY (Pts 45-6yrs Insurance coverage will need to be confirmed)  Discontinued   Immunization History  Administered Date(s) Administered   Fluad Quad(high Dose 65+) 12/17/2019, 11/30/2020   Influenza Split 01/01/2012   Influenza Whole 02/21/2006   Influenza, High Dose Seasonal PF 01/23/2017, 01/22/2018, 01/15/2019   Influenza,inj,Quad PF,6+ Mos 12/25/2013, 01/22/2018   Influenza-Unspecified Jul 22, 1945, 01/15/2013, 01/12/2015, 02/03/2016   Moderna Sars-Covid-2 Vaccination 06/02/2019, 06/30/2019, 01/30/2020   Pneumococcal Conjugate-13 02/18/2014   Pneumococcal Polysaccharide-23 11/28/2011   Td 02/21/2006   Tdap 04/18/2016   Zoster Recombinat (Shingrix) 10/11/2020, 01/17/2021   Zoster, Live 10/09/2008    These are the patient goals that we discussed:  Goals Addressed              This Visit's Progress     Patient Stated (pt-stated)        Loose 5 lbs.        This is a list of Health Maintenance Items that are overdue or due now: There are no preventive care reminders to display for this patient.   Orders/Referrals Placed Today: No orders of the defined types were placed in this encounter.  (Contact our referral department at [phone] if you have not spoken with someone about your referral appointment within the next 5 days)    Follow-up  Plan Follow-up with Hali Marry, MD as planned Medicare wellness visit in one year.  AVS printed and mailed to the patient.     Health Maintenance, Male Adopting a healthy lifestyle and getting preventive care are important in promoting health and wellness. Ask your health care provider about: The right schedule for you to have regular tests and exams. Things you can do on your own to prevent diseases and keep yourself healthy. What should I know about diet, weight, and exercise? Eat a healthy diet  Eat a diet that includes plenty of vegetables, fruits, low-fat dairy products, and lean protein. Do not eat a lot of foods that are high in solid fats, added sugars, or sodium. Maintain a healthy weight Body mass index (BMI) is a measurement that can be used to identify possible weight problems. It estimates body fat based on height and weight. Your health care provider can help determine your BMI and help you achieve or maintain a healthy weight. Get regular exercise Get regular exercise. This is one of the most important things you can do for your health. Most adults should: Exercise for at least 150 minutes each week. The exercise should increase your heart rate and make you sweat (moderate-intensity exercise). Do strengthening exercises at least twice a week. This is in addition to the moderate-intensity exercise. Spend less time sitting. Even light physical activity can be beneficial. Watch cholesterol and blood lipids Have your blood tested for lipids and cholesterol at 77 years of age, then have this test every 5 years. You may need to have your  cholesterol levels checked more often if: Your lipid or cholesterol levels are high. You are older than 77 years of age. You are at high risk for heart disease. What should I know about cancer screening? Many types of cancers can be detected early and may often be prevented. Depending on your health history and family history, you  may need to have cancer screening at various ages. This may include screening for: Colorectal cancer. Prostate cancer. Skin cancer. Lung cancer. What should I know about heart disease, diabetes, and high blood pressure? Blood pressure and heart disease High blood pressure causes heart disease and increases the risk of stroke. This is more likely to develop in people who have high blood pressure readings or are overweight. Talk with your health care provider about your target blood pressure readings. Have your blood pressure checked: Every 3-5 years if you are 26-64 years of age. Every year if you are 37 years old or older. If you are between the ages of 52 and 39 and are a current or former smoker, ask your health care provider if you should have a one-time screening for abdominal aortic aneurysm (AAA). Diabetes Have regular diabetes screenings. This checks your fasting blood sugar level. Have the screening done: Once every three years after age 44 if you are at a normal weight and have a low risk for diabetes. More often and at a younger age if you are overweight or have a high risk for diabetes. What should I know about preventing infection? Hepatitis B If you have a higher risk for hepatitis B, you should be screened for this virus. Talk with your health care provider to find out if you are at risk for hepatitis B infection. Hepatitis C Blood testing is recommended for: Everyone born from [birth year] through 1965. Anyone with known risk factors for hepatitis C. Sexually transmitted infections (STIs) You should be screened each year for STIs, including gonorrhea and chlamydia, if: You are sexually active and are younger than 77 years of age. You are older than 77 years of age and your health care provider tells you that you are at risk for this type of infection. Your sexual activity has changed since you were last screened, and you are at increased risk for chlamydia or gonorrhea. Ask your  health care provider if you are at risk. Ask your health care provider about whether you are at high risk for HIV. Your health care provider may recommend a prescription medicine to help prevent HIV infection. If you choose to take medicine to prevent HIV, you should first get tested for HIV. You should then be tested every 3 months for as long as you are taking the medicine. Follow these instructions at home: Alcohol use Do not drink alcohol if your health care provider tells you not to drink. If you drink alcohol: Limit how much you have to 0-2 drinks a day. Know how much alcohol is in your drink. In the [place] one drink equals one 12 oz bottle of beer (355 mL), one 5 oz glass of wine (148 mL), or one 1 oz glass of hard liquor (44 mL). Lifestyle Do not use any products that contain nicotine or tobacco. These products include cigarettes, chewing tobacco, and vaping devices, such as e-cigarettes. If you need help quitting, ask your health care provider. Do not use street drugs. Do not share needles. Ask your health care provider for help if you need support or information about quitting drugs. General instructions Schedule regular  health, dental, and eye exams. Stay current with your vaccines. Tell your health care provider if: You often feel depressed. You have ever been abused or do not feel safe at home. Summary Adopting a healthy lifestyle and getting preventive care are important in promoting health and wellness. Follow your health care provider's instructions about healthy diet, exercising, and getting tested or screened for diseases. Follow your health care provider's instructions on monitoring your cholesterol and blood pressure. This information is not intended to replace advice given to you by your health care provider. Make sure you discuss any questions you have with your health care provider. Document Revised: 08/09/2020 Document Reviewed: 08/09/2020 Elsevier Patient Education   [employer].

## 2021-05-16 NOTE — Progress Notes (Signed)
MEDICARE ANNUAL WELLNESS VISIT  05/16/2021  Telephone Visit Disclaimer This Medicare AWV was conducted by telephone due to national recommendations for restrictions regarding the COVID-19 Pandemic (e.g. social distancing).  I verified, using two identifiers, that I am speaking with Brad Cannon or their authorized healthcare agent. I discussed the limitations, risks, security, and privacy concerns of performing an evaluation and management service by telephone and the potential availability of an in-person appointment in the future. The patient expressed understanding and agreed to proceed.  Location of Patient: Home Location of Provider (nurse):  In the office.  Subjective:    Brad Cannon is a 76 y.o. male patient of Brad Cannon, Brad Kocher, MD who had a Medicare Annual Wellness Visit today via telephone. Brad Cannon is Retired and lives with their spouse. he has 2 children. he reports that he is socially active and does interact with friends/family regularly. he is moderately physically active and enjoys making ship model.  Patient Care Team: Hali Marry, MD as PCP - General (Family Medicine)  Advanced Directives 05/16/2021 12/22/2019 11/26/2018 09/08/2014  Does Patient Have a Medical Advance Directive? No No No No  Would patient like information on creating a medical advance directive? No - Patient declined Yes (MAU/Ambulatory/Procedural Areas - Information given) Yes (MAU/Ambulatory/Procedural Areas - Information given) Yes - Educational materials given    Hospital Utilization Over the Past 12 Months: # of hospitalizations or ER visits: 0 # of surgeries: 0  Review of Systems    Patient reports that his overall health is unchanged compared to last year.  History obtained from chart review and the patient  Patient Reported Readings (BP, Pulse, CBG, Weight, etc) none  Pain Assessment Pain : No/denies pain     Current Medications & Allergies  (verified) Allergies as of 05/16/2021   No Known Allergies      Medication List        Accurate as of May 16, 2021  2:13 PM. If you have any questions, ask your nurse or doctor.          atorvastatin 40 MG tablet Commonly known as: LIPITOR TAKE 1 TABLET EVERY DAY   ipratropium 0.06 % nasal spray Commonly known as: ATROVENT Place 1-2 sprays into both nostrils 2 (two) times daily as needed for rhinitis.   lisinopril 5 MG tablet Commonly known as: ZESTRIL Take 1 tablet (5 mg total) by mouth daily.        History (reviewed): Past Medical History:  Diagnosis Date   AAA (abdominal aortic aneurysm)    Hepatitis A 1998   with elevated LFT's   Hyperlipidemia    Hypertension    IFG (impaired fasting glucose)    Past Surgical History:  Procedure Laterality Date   arthroscopic surgery  1982   left knee   Family History  Problem Relation Age of Onset   Hypertension Mother    Hyperlipidemia Mother    Heart disease Father    Hyperlipidemia Father    Hypertension Father    Hypertension Sister    Hyperlipidemia Sister    Hyperlipidemia Brother    Hypertension Brother    Heart disease Brother    Social History   Socioeconomic History   Marital status: Married    Spouse name: Brad Cannon   Number of children: 2   Years of education: 4   Highest education level: Master's degree (e.g., MA, MS, MEng, MEd, MSW, MBA)  Occupational History   Occupation: Teacher, 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 He has two children. He enjoys making ship model.   Social Determinants of Health   Financial Resource Strain:  Low Risk    Difficulty of Paying Living Expenses: Not hard at all  Food Insecurity: No Food Insecurity   Worried About Charity fundraiser in the Last Year: Never true   [place] in the Last Year: Never true  Transportation Needs: No Transportation Needs   Lack of Transportation (Medical): No   Lack of Transportation (Non-Medical): No  Physical Activity: Sufficiently Active   Days of Exercise per Week: 5 days   Minutes of Exercise per Session: 120 min  Stress: No Stress Concern Present   Feeling of Stress : Not at all  Social Connections: Moderately Isolated   Frequency of Communication with Friends and Family: Once a week   Frequency of Social Gatherings with Friends and Family: Twice a week   Attends Religious Services: Never   Marine scientist or Organizations: No   Attends Archivist Meetings: Never   Marital Status: Married    Activities of Daily Living In your present state of health, do you have any difficulty performing the following activities: 05/16/2021  Hearing? N  Vision? N  Difficulty concentrating or making decisions? N  Walking or climbing stairs? N  Dressing or bathing? N  Doing errands, shopping? N  Preparing Food and eating ? N  Using the Toilet? N  In the past six months, have you accidently leaked urine? N  Do you have problems with loss of bowel control? N  Managing your Medications? N  Managing your Finances? N  Housekeeping or managing your Housekeeping? N  Some recent data might be hidden    Patient Education/ Literacy How often do you need to have someone help you when you read instructions, pamphlets, or other written materials from your doctor or pharmacy?: 1 - Never What is the last grade level you completed in school?: Master's degree  Exercise Current Exercise Habits: Home exercise routine, Type of exercise: walking;Other - see comments (swimming), Time (Minutes): > 60, Frequency (Times/Week): 5, Weekly Exercise  (Minutes/Week): 0, Intensity: Moderate, Exercise limited by: None identified  Diet Patient reports consuming 2 meals a day and 0 snack(s) a day Patient reports that his primary diet is: Regular Patient reports that she does have regular access to food.   Depression Screen PHQ 2/9 Scores 05/16/2021 04/07/2021 12/22/2019 03/31/2019 11/26/2018 03/21/2018 09/04/2017  PHQ - 2 Score 0 0 0 0 0 0 0     Fall Risk Fall Risk  05/16/2021 04/07/2021 12/22/2019 03/31/2019 11/26/2018  Falls in the past year? 0 0 0 0 0  Number falls in past yr: 0 0 0 0 -  Injury with Fall? 0 0 0 0 -  Risk for fall due to : No Fall Risks No Fall Risks - - -  Follow up Falls evaluation completed Falls prevention discussed;Falls evaluation completed Falls evaluation completed - Falls prevention discussed     Objective:  Deontrey Massi  Pargas seemed alert and oriented and he participated appropriately during our telephone visit.  Blood Pressure Weight BMI  BP Readings from Last 3 Encounters:  04/07/21 116/68  02/01/21 123/76  10/05/20 119/66   Wt Readings from Last 3 Encounters:  04/07/21 159 lb (72.1 kg)  02/01/21 157 lb 0.6 oz (71.2 kg)  10/05/20 158 lb (71.7 kg)   BMI Readings from Last 1 Encounters:  04/07/21 25.66 kg/m    *Unable to obtain current vital signs, weight, and BMI due to telephone visit type  Hearing/Vision  Placido did not seem to have difficulty with hearing/understanding during the telephone conversation Reports that he has had a formal eye exam by an eye care professional within the past year Reports that he has not had a formal hearing evaluation within the past year *Unable to fully assess hearing and vision during telephone visit type  Cognitive Function: 6CIT Screen 05/16/2021 12/22/2019 11/26/2018  What Year? 0 points - 0 points  What month? 0 points - 0 points  What time? 0 points 0 points 0 points  Count back from 20 0 points 0 points 0 points  Months in reverse 0 points 0 points 0 points   Repeat phrase 0 points 0 points 0 points  Total Score 0 - 0   (Normal:0-7, Significant for Dysfunction: >8)  Normal Cognitive Function Screening: Yes   Immunization & Health Maintenance Record Immunization History  Administered Date(s) Administered   Fluad Quad(high Dose 65+) 12/17/2019, 11/30/2020   Influenza Split 01/01/2012   Influenza Whole 02/21/2006   Influenza, High Dose Seasonal PF 01/23/2017, 01/22/2018, 01/15/2019   Influenza,inj,Quad PF,6+ Mos 12/25/2013, 01/22/2018   Influenza-Unspecified 1945/07/21, 01/15/2013, 01/12/2015, 02/03/2016   Moderna Sars-Covid-2 Vaccination 06/02/2019, 06/30/2019, 01/30/2020   Pneumococcal Conjugate-13 02/18/2014   Pneumococcal Polysaccharide-23 11/28/2011   Td 02/21/2006   Tdap 04/18/2016   Zoster Recombinat (Shingrix) 01/17/2021   Zoster, Live 10/09/2008    Health Maintenance  Topic Date Due   COVID-19 Vaccine (4 - Booster for Moderna series) 03/26/2020   Zoster Vaccines- Shingrix (2 of 2) 03/14/2021   TETANUS/TDAP  04/18/2026   Pneumonia Vaccine 38+ Years old  Completed   INFLUENZA VACCINE  Completed   Hepatitis C Screening  Completed   HPV VACCINES  Aged Out   COLONOSCOPY (Pts 45-92yrs Insurance coverage will need to be confirmed)  Discontinued       Assessment  This is a routine wellness examination for [employer].  Health Maintenance: Due or Overdue Health Maintenance Due  Topic Date Due   COVID-19 Vaccine (4 - Booster for Moderna series) 03/26/2020   Zoster Vaccines- Shingrix (2 of 2) 03/14/2021    Brad Cannon does not need a referral for Community Assistance: Care Management:   no Social Work:    no Prescription Assistance:  no Nutrition/Diabetes Education:  no   Plan:  Personalized Goals  Goals Addressed               This Visit's Progress     Patient Stated (pt-stated)        Loose 5 lbs.       Personalized Health Maintenance & Screening Recommendations  There are no preventive  care reminders to display for this patient.   Lung Cancer Screening Recommended: no (Low Dose CT Chest recommended if Age 32-80 years, 30 pack-year currently smoking OR have quit w/in past 15 years) Hepatitis C Screening recommended: no HIV Screening recommended: no  Advanced Directives: Written information was not prepared per patient's  request.  Referrals & Orders No orders of the defined types were placed in this encounter.  Follow-up Plan Follow-up with Hali Marry, MD as planned Medicare wellness visit in one year.  AVS printed and mailed to the patient.   I have personally reviewed and noted the following in the patients chart:   Medical and social history Use of alcohol, tobacco or illicit drugs  Current medications and supplements Functional ability and status Nutritional status Physical activity Advanced directives List of other physicians Hospitalizations, surgeries, and ER visits in previous 12 months Vitals Screenings to include cognitive, depression, and falls Referrals and appointments  In addition, I have reviewed and discussed with Brad Cannon certain preventive protocols, quality metrics, and best practice recommendations. A written personalized care plan for preventive services as well as general preventive health recommendations is available and can be mailed to the patient at his request.      Tinnie Gens, RN  05/16/2021

## 2021-09-09 ENCOUNTER — Telehealth: Payer: Self-pay | Admitting: Family Medicine

## 2021-09-09 MED ORDER — ATORVASTATIN CALCIUM 40 MG PO TABS: 40.0000 mg | ORAL_TABLET | Freq: Every day | ORAL | 3 refills | Status: DC

## 2021-09-09 NOTE — Telephone Encounter (Signed)
Refill sent.

## 2021-09-09 NOTE — Telephone Encounter (Signed)
Pt called. He needs a refill on his Atorvastatin(mail order).

## 2021-10-11 ENCOUNTER — Ambulatory Visit (INDEPENDENT_AMBULATORY_CARE_PROVIDER_SITE_OTHER): Payer: Medicare HMO | Admitting: Family Medicine

## 2021-10-11 ENCOUNTER — Encounter: Payer: Self-pay | Admitting: Family Medicine

## 2021-10-11 VITALS — BP 132/68 | HR 58 | Ht 66.0 in | Wt 160.0 lb

## 2021-10-11 DIAGNOSIS — I10 Essential (primary) hypertension: Secondary | ICD-10-CM

## 2021-10-11 DIAGNOSIS — I77811 Abdominal aortic ectasia: Secondary | ICD-10-CM | POA: Diagnosis not present

## 2021-10-11 DIAGNOSIS — R7301 Impaired fasting glucose: Secondary | ICD-10-CM | POA: Diagnosis not present

## 2021-10-11 LAB — POCT GLYCOSYLATED HEMOGLOBIN (HGB A1C): Hemoglobin A1C: 5.2 % (ref 4.0–5.6)

## 2021-10-11 NOTE — Assessment & Plan Note (Signed)
Controlled continue current regimen.  Discussed the importance of keeping it under good control especially with aortic ectasia.

## 2021-10-11 NOTE — Progress Notes (Signed)
   Established Patient Office Visit  Subjective   Patient ID: Brad Cannon, male    DOB: 14-Dec-1945  Age: 76 y.o. MRN: [id number]  Chief Complaint  Patient presents with   Hypertension    HPI  Hypertension- Pt denies chest pain, SOB, dizziness, or heart palpitations.  Taking meds as directed w/o problems.  Denies medication side effects.    Impaired fasting glucose-no increased thirst or urination. No symptoms consistent with hypoglycemia.  Still exercising regularly by swimming 3 days/week.  He recently was on vacation so got off track a little bit.    ROS    Objective:     BP 132/68   Pulse (!) 58   Ht '5\' 6"'$  (1.676 m)   Wt 160 lb (72.6 kg)   SpO2 98%   BMI 25.82 kg/m    Physical Exam Constitutional:      Appearance: He is well-developed.  HENT:     Head: Normocephalic and atraumatic.  Cardiovascular:     Rate and Rhythm: Normal rate and regular rhythm.     Heart sounds: Normal heart sounds.  Pulmonary:     Effort: Pulmonary effort is normal.     Breath sounds: Normal breath sounds.  Skin:    General: Skin is warm and dry.  Neurological:     Mental Status: He is alert and oriented to person, place, and time.  Psychiatric:        Behavior: Behavior normal.      Results for orders placed or performed in visit on 10/11/21  POCT glycosylated hemoglobin (Hb A1C)  Result Value Ref Range   Hemoglobin A1C 5.2 4.0 - 5.6 %   HbA1c POC (<> result, manual entry)     HbA1c, POC (prediabetic range)     HbA1c, POC (controlled diabetic range)        The ASCVD Risk score (Arnett DK, et al., 2019) failed to calculate for the following reasons:   The valid total cholesterol range is 130 to 320 mg/dL    Assessment & Plan:   Problem List Items Addressed This Visit       Cardiovascular and Mediastinum   HYPERTENSION, BENIGN ESSENTIAL    Controlled continue current regimen.  Discussed the importance of keeping it under good control especially with aortic  ectasia.      Relevant Orders   BASIC METABOLIC PANEL WITH GFR   Abdominal aortic ectasia (HCC)    Was 2.9 cm.  We will plan to repeat scan in July 2025.        Endocrine   IFG (impaired fasting glucose) - Primary    1C looks phenomenal we can probably go back to checking once a year.  Great job on regular exercise.      Relevant Orders   POCT glycosylated hemoglobin (Hb A1C) (Completed)    Return in about 6 months (around 04/13/2022) for Hypertension and Prediabetes.    Beatrice Lecher, MD

## 2021-10-11 NOTE — Assessment & Plan Note (Signed)
1C looks phenomenal we can probably go back to checking once a year.  Great job on regular exercise.

## 2021-10-11 NOTE — Assessment & Plan Note (Signed)
Was 2.9 cm.  We will plan to repeat scan in July 2025.

## 2021-10-12 LAB — BASIC METABOLIC PANEL WITH GFR
BUN: 17 mg/dL (ref 7–25)
CO2: 27 mmol/L (ref 20–32)
Calcium: 9.2 mg/dL (ref 8.6–10.3)
Chloride: 107 mmol/L (ref 98–110)
Creat: 0.98 mg/dL (ref 0.70–1.28)
Glucose, Bld: 88 mg/dL (ref 65–99)
Potassium: 4 mmol/L (ref 3.5–5.3)
Sodium: 140 mmol/L (ref 135–146)
eGFR: 80 mL/min/{1.73_m2} (ref >=60)

## 2021-10-12 NOTE — Progress Notes (Signed)
Your lab work is within acceptable range and there are no concerning findings.   ?

## 2021-10-17 DIAGNOSIS — H524 Presbyopia: Secondary | ICD-10-CM | POA: Diagnosis not present

## 2021-10-20 DIAGNOSIS — Z0100 Encounter for examination of eyes and vision without abnormal findings: Secondary | ICD-10-CM | POA: Diagnosis not present

## 2022-01-18 ENCOUNTER — Telehealth: Payer: Self-pay | Admitting: Family Medicine

## 2022-01-18 NOTE — Telephone Encounter (Signed)
Unable to leave message on cell phone.  Did leave message on home phone regarding the recent passing of his wife.

## 2022-01-23 ENCOUNTER — Encounter: Payer: Self-pay | Admitting: Family Medicine

## 2022-01-23 ENCOUNTER — Ambulatory Visit (INDEPENDENT_AMBULATORY_CARE_PROVIDER_SITE_OTHER): Payer: Medicare HMO | Admitting: Family Medicine

## 2022-01-23 VITALS — BP 125/72 | HR 75 | Ht 66.0 in | Wt 155.0 lb

## 2022-01-23 DIAGNOSIS — R030 Elevated blood-pressure reading, without diagnosis of hypertension: Secondary | ICD-10-CM

## 2022-01-23 DIAGNOSIS — F4321 Adjustment disorder with depressed mood: Secondary | ICD-10-CM | POA: Diagnosis not present

## 2022-01-23 DIAGNOSIS — H7391 Unspecified disorder of tympanic membrane, right ear: Secondary | ICD-10-CM

## 2022-01-23 DIAGNOSIS — I10 Essential (primary) hypertension: Secondary | ICD-10-CM | POA: Diagnosis not present

## 2022-01-23 DIAGNOSIS — R1032 Left lower quadrant pain: Secondary | ICD-10-CM

## 2022-01-23 NOTE — Progress Notes (Signed)
Acute Office Visit  Subjective:     Patient ID: Brad Cannon, male    DOB: [birth year], 76 y.o.   MRN: [id number]  Chief Complaint  Patient presents with   Groin Pain    L sided x 1 week     HPI Patient is in today for shooting pain in left groin area. Worse when lays down.  Feels like it is triggered by movement. Notices a discomfort radiating to the testicle when crossing the legs.  Able to exercise. Started swimming again recently and it hasn't prevented him from exercising.  No injury or trauma. Had been helping his wife transition. She sadly, passed recently so he is also dealing with grief. He does have a supportive family.   Concerned about BP trending up. - BPs running 135/74 at home.  Not sleeping well lately ( only getting 4-5 hours) . No excess salt in diet. Not taking Benadryl or decongestants or NSAID.    Right ear feels "foggy at times. He would like to have it checked today. no drainage or URI sxs. Has been going on for awhile.   ROS      Objective:    BP 125/72   Pulse 75   Ht '5\' 6"'$  (1.676 m)   Wt 155 lb (70.3 kg)   SpO2 97%   BMI 25.02 kg/m    Physical Exam Vitals reviewed.  Constitutional:      Appearance: He is well-developed.  HENT:     Head: Normocephalic and atraumatic.     Right Ear: Ear canal and external ear normal.     Left Ear: Tympanic membrane, ear canal and external ear normal.     Ears:     Comments: TM on right appears more thickened and dull.  Eyes:     Conjunctiva/sclera: Conjunctivae normal.  Cardiovascular:     Rate and Rhythm: Normal rate.  Pulmonary:     Effort: Pulmonary effort is normal.  Genitourinary:    Comments: No palpable hernia on right or left.  No palpable mass on the testicle.  Skin:    General: Skin is dry.     Coloration: Skin is not pale.  Neurological:     Mental Status: He is alert and oriented to person, place, and time.  Psychiatric:        Behavior: Behavior normal.     No results found  for any visits on 01/23/22.      Assessment & Plan:   Problem List Items Addressed This Visit       Cardiovascular and Mediastinum   HYPERTENSION, BENIGN ESSENTIAL    BP looks great today!!! Will monitor for a couple more weeks before we adjust medication.           Other   Left groin pain - Primary    Possible muscle strain. No palpable mass on the testicle and no palpable hernia on exam.  Conservative care and monitor for next 2-3 weeks. If not improving then please let us know and will work up further.       Grief    Encouraged him to reach out at any point if he needs additional support.        Other Visit Diagnoses     Elevated BP without diagnosis of hypertension       Tm (tympanic membrane disorder), right       Relevant Orders   Ambulatory referral to ENT        No orders  of the defined types were placed in this encounter.   No follow-ups on file.  Beatrice Lecher, MD

## 2022-01-24 DIAGNOSIS — F4321 Adjustment disorder with depressed mood: Secondary | ICD-10-CM | POA: Insufficient documentation

## 2022-01-24 DIAGNOSIS — R1032 Left lower quadrant pain: Secondary | ICD-10-CM | POA: Insufficient documentation

## 2022-01-24 NOTE — Assessment & Plan Note (Addendum)
BP looks great today!!! Will monitor for a couple more weeks before we adjust medication.

## 2022-01-24 NOTE — Assessment & Plan Note (Signed)
Encouraged him to reach out at any point if he needs additional support.

## 2022-01-24 NOTE — Assessment & Plan Note (Signed)
Possible muscle strain. No palpable mass on the testicle and no palpable hernia on exam.  Conservative care and monitor for next 2-3 weeks. If not improving then please let us know and will work up further.

## 2022-01-30 ENCOUNTER — Other Ambulatory Visit: Payer: Self-pay | Admitting: Family Medicine

## 2022-03-06 DIAGNOSIS — H903 Sensorineural hearing loss, bilateral: Secondary | ICD-10-CM | POA: Diagnosis not present

## 2022-04-13 ENCOUNTER — Encounter: Payer: Self-pay | Admitting: Family Medicine

## 2022-04-13 ENCOUNTER — Ambulatory Visit (INDEPENDENT_AMBULATORY_CARE_PROVIDER_SITE_OTHER): Payer: Medicare HMO | Admitting: Family Medicine

## 2022-04-13 VITALS — BP 143/82 | HR 65 | Ht 66.0 in | Wt 159.0 lb

## 2022-04-13 DIAGNOSIS — R7301 Impaired fasting glucose: Secondary | ICD-10-CM

## 2022-04-13 DIAGNOSIS — R209 Unspecified disturbances of skin sensation: Secondary | ICD-10-CM | POA: Diagnosis not present

## 2022-04-13 DIAGNOSIS — I77811 Abdominal aortic ectasia: Secondary | ICD-10-CM

## 2022-04-13 DIAGNOSIS — E785 Hyperlipidemia, unspecified: Secondary | ICD-10-CM | POA: Diagnosis not present

## 2022-04-13 DIAGNOSIS — I10 Essential (primary) hypertension: Secondary | ICD-10-CM | POA: Diagnosis not present

## 2022-04-13 LAB — POCT GLYCOSYLATED HEMOGLOBIN (HGB A1C): Hemoglobin A1C: 5.6 % (ref 4.0–5.6)

## 2022-04-13 MED ORDER — LISINOPRIL 5 MG PO TABS: 5.0000 mg | ORAL_TABLET | Freq: Every day | ORAL | 3 refills | Status: DC

## 2022-04-13 NOTE — Assessment & Plan Note (Signed)
Due for repeat evaluation next year.

## 2022-04-13 NOTE — Assessment & Plan Note (Signed)
A1c looks great today.  Continue to work on healthy diet and regular exercise and we will continue to monitor.  Lab Results  Component Value Date   HGBA1C 5.6 04/13/2022

## 2022-04-13 NOTE — Assessment & Plan Note (Signed)
BP is up today which is unusual for him . Repeat in 2-3 weeks with nurse visit.

## 2022-04-13 NOTE — Progress Notes (Signed)
Established Patient Office Visit  Subjective   Patient ID: Brad Cannon, male    DOB: 11/25/1945  Age: 77 y.o. MRN: [id number]  Chief Complaint  Patient presents with   Follow-up   Hypertension    HPI  Hypertension- Pt denies chest pain, SOB, dizziness, or heart palpitations.  Taking meds as directed w/o problems.  Denies medication side effects.    Impaired fasting glucose-no increased thirst or urination. No symptoms consistent with hypoglycemia.  Also wanted to let me know about 1 episode where he hide his feet are cold while he had been swimming and exercising from 75 minutes. He hasn't had it happen again but has been moving his legs more by pushing off the wall since then.  Temp has been cooler where he swims.      ROS    Objective:     BP (!) 143/82   Pulse 65   Ht '5\' 6"'$  (1.676 m)   Wt 159 lb (72.1 kg)   SpO2 95%   BMI 25.66 kg/m    Physical Exam Constitutional:      Appearance: He is well-developed.  HENT:     Head: Normocephalic and atraumatic.  Cardiovascular:     Rate and Rhythm: Normal rate and regular rhythm.     Heart sounds: Normal heart sounds.  Pulmonary:     Effort: Pulmonary effort is normal.     Breath sounds: Normal breath sounds.  Skin:    General: Skin is warm and dry.  Neurological:     Mental Status: He is alert and oriented to person, place, and time.  Psychiatric:        Behavior: Behavior normal.      Results for orders placed or performed in visit on 04/13/22  POCT glycosylated hemoglobin (Hb A1C)  Result Value Ref Range   Hemoglobin A1C 5.6 4.0 - 5.6 %   HbA1c POC (<> result, manual entry)     HbA1c, POC (prediabetic range)     HbA1c, POC (controlled diabetic range)        The ASCVD Risk score (Arnett DK, et al., 2019) failed to calculate for the following reasons:   The valid total cholesterol range is 130 to 320 mg/dL    Assessment & Plan:   Problem List Items Addressed This Visit        Cardiovascular and Mediastinum   HYPERTENSION, BENIGN ESSENTIAL - Primary    BP is up today which is unusual for him . Repeat in 2-3 weeks with nurse visit.        Relevant Medications   lisinopril (ZESTRIL) 5 MG tablet   Other Relevant Orders   Lipid Panel w/reflex Direct LDL   COMPLETE METABOLIC PANEL WITH GFR   Abdominal aortic ectasia (HCC)    Due for repeat evaluation next year.       Relevant Medications   lisinopril (ZESTRIL) 5 MG tablet     Endocrine   IFG (impaired fasting glucose)    A1c looks great today.  Continue to work on healthy diet and regular exercise and we will continue to monitor.  Lab Results  Component Value Date   HGBA1C 5.6 04/13/2022        Relevant Orders   Lipid Panel w/reflex Direct LDL   COMPLETE METABOLIC PANEL WITH GFR   POCT glycosylated hemoglobin (Hb A1C) (Completed)     Other   Hyperlipemia   Relevant Medications   lisinopril (ZESTRIL) 5 MG tablet   Other Relevant  Orders   Lipid Panel w/reflex Direct LDL   COMPLETE METABOLIC PANEL WITH GFR   Other Visit Diagnoses     Bilateral cold feet          Cold feet, bilateral-occurred during exercise while swimming for an extended period of time.  It only happened once at this point so I suspect it was probably the cooler temperatures and water that was causing some shunting to his core.  Especially with prolonged swimming as his body was probably cooling down.  We did discuss that if it continues we can always do ABIs to workup possible peripheral vascular disease.  Return in about 6 months (around 10/12/2022) for Hypertension.    Beatrice Lecher, MD

## 2022-04-14 LAB — COMPLETE METABOLIC PANEL WITH GFR
AG Ratio: 1.5 (calc) (ref 1.0–2.5)
ALT: 13 U/L (ref 9–46)
AST: 38 U/L — ABNORMAL HIGH (ref 10–35)
Albumin: 4.3 g/dL (ref 3.6–5.1)
Alkaline phosphatase (APISO): 81 U/L (ref 35–144)
BUN: 21 mg/dL (ref 7–25)
CO2: 27 mmol/L (ref 20–32)
Calcium: 9.6 mg/dL (ref 8.6–10.3)
Chloride: 106 mmol/L (ref 98–110)
Creat: 0.88 mg/dL (ref 0.70–1.28)
Globulin: 2.8 g/dL (calc) (ref 1.9–3.7)
Glucose, Bld: 98 mg/dL (ref 65–99)
Potassium: 5 mmol/L (ref 3.5–5.3)
Sodium: 140 mmol/L (ref 135–146)
Total Bilirubin: 0.8 mg/dL (ref 0.2–1.2)
Total Protein: 7.1 g/dL (ref 6.1–8.1)
eGFR: 89 mL/min/{1.73_m2} (ref >=60)

## 2022-04-14 LAB — LIPID PANEL W/REFLEX DIRECT LDL
Cholesterol: 120 mg/dL (ref <200)
HDL: 54 mg/dL (ref >=40)
LDL Cholesterol (Calc): 55 mg/dL (calc)
Non-HDL Cholesterol (Calc): 66 mg/dL (calc) (ref <130)
Total CHOL/HDL Ratio: 2.2 (calc) (ref <5.0)
Triglycerides: 38 mg/dL (ref <150)

## 2022-04-14 NOTE — Progress Notes (Signed)
Your lab work is within acceptable range and there are no concerning findings.   ?

## 2022-05-22 ENCOUNTER — Ambulatory Visit (INDEPENDENT_AMBULATORY_CARE_PROVIDER_SITE_OTHER): Payer: Medicare HMO | Admitting: Family Medicine

## 2022-05-22 DIAGNOSIS — Z0000 Encounter for general adult medical examination without abnormal findings: Secondary | ICD-10-CM | POA: Diagnosis not present

## 2022-05-22 NOTE — Patient Instructions (Signed)
[facility] Maintenance Summary and Written Plan of Care  [name] ,  Thank you for allowing me to perform your Medicare Annual Wellness Visit and for your ongoing commitment to your health.   Health Maintenance & Immunization History Health Maintenance  Topic Date Due   COVID-19 Vaccine (4 - 2023-24 season) 02/09/2023 (Originally 12/02/2021)   Medicare Annual Wellness (AWV)  05/23/2023   DTaP/Tdap/Td (3 - Td or Tdap) 04/18/2026   Pneumonia Vaccine 25+ Years old  Completed   INFLUENZA VACCINE  Completed   Hepatitis C Screening  Completed   Zoster Vaccines- Shingrix  Completed   HPV VACCINES  Aged Out   COLONOSCOPY (Pts 45-39yr Insurance coverage will need to be confirmed)  Discontinued   Immunization History  Administered Date(s) Administered   Fluad Quad(high Dose 65+) 12/17/2019, 11/30/2020   Influenza Split 01/01/2012   Influenza Whole 02/21/2006   Influenza, High Dose Seasonal PF 01/23/2017, 01/22/2018, 01/15/2019   Influenza,inj,Quad PF,6+ Mos 12/25/2013, 01/22/2018, 12/21/2021   Influenza-Unspecified 105/05/46 01/15/2013, 01/12/2015, 02/03/2016   Moderna Sars-Covid-2 Vaccination 06/02/2019, 06/30/2019, 01/30/2020   Pneumococcal Conjugate-13 02/18/2014   Pneumococcal Polysaccharide-23 11/28/2011   Td 02/21/2006   Tdap 04/18/2016   Zoster Recombinat (Shingrix) 10/11/2020, 01/17/2021   Zoster, Live 10/09/2008    These are the patient goals that we discussed:  Goals Addressed               This Visit's Progress     Patient Stated (pt-stated)        Patient stated that he would like to continue his healthy lifestyle.         This is a list of Health Maintenance Items that are overdue or due now: There are no preventive care reminders to display for this patient.    Orders/Referrals Placed Today: No orders of the defined types were placed in this encounter.  (Contact our referral department at [phone] you have not  spoken with someone about your referral appointment within the next 5 days)    Follow-up Plan Follow-up with MHali Marry MD as planned Medicare wellness visit in one year AVS printed and mailed to the patient.      Health Maintenance, Male Adopting a healthy lifestyle and getting preventive care are important in promoting health and wellness. Ask your health care provider about: The right schedule for you to have regular tests and exams. Things you can do on your own to prevent diseases and keep yourself healthy. What should I know about diet, weight, and exercise? Eat a healthy diet  Eat a diet that includes plenty of vegetables, fruits, low-fat dairy products, and lean protein. Do not eat a lot of foods that are high in solid fats, added sugars, or sodium. Maintain a healthy weight Body mass index (BMI) is a measurement that can be used to identify possible weight problems. It estimates body fat based on height and weight. Your health care provider can help determine your BMI and help you achieve or maintain a healthy weight. Get regular exercise Get regular exercise. This is one of the most important things you can do for your health. Most adults should: Exercise for at least 150 minutes each week. The exercise should increase your heart rate and make you sweat (moderate-intensity exercise). Do strengthening exercises at least twice a week. This is in addition to the moderate-intensity exercise. Spend less time sitting. Even light physical activity can be beneficial. Watch cholesterol and blood lipids Have your blood tested  for lipids and cholesterol at 78 years of age, then have this test every 5 years. You may need to have your cholesterol levels checked more often if: Your lipid or cholesterol levels are high. You are older than 78 years of age. You are at high risk for heart disease. What should I know about cancer screening? Many types of cancers can be detected  early and may often be prevented. Depending on your health history and family history, you may need to have cancer screening at various ages. This may include screening for: Colorectal cancer. Prostate cancer. Skin cancer. Lung cancer. What should I know about heart disease, diabetes, and high blood pressure? Blood pressure and heart disease High blood pressure causes heart disease and increases the risk of stroke. This is more likely to develop in people who have high blood pressure readings or are overweight. Talk with your health care provider about your target blood pressure readings. Have your blood pressure checked: Every 3-5 years if you are 3-51 years of age. Every year if you are 45 years old or older. If you are between the ages of 57 and 17 and are a current or former smoker, ask your health care provider if you should have a one-time screening for abdominal aortic aneurysm (AAA). Diabetes Have regular diabetes screenings. This checks your fasting blood sugar level. Have the screening done: Once every three years after age 74 if you are at a normal weight and have a low risk for diabetes. More often and at a younger age if you are overweight or have a high risk for diabetes. What should I know about preventing infection? Hepatitis B If you have a higher risk for hepatitis B, you should be screened for this virus. Talk with your health care provider to find out if you are at risk for hepatitis B infection. Hepatitis C Blood testing is recommended for: Everyone born from [birth year] through 1966. Anyone with known risk factors for hepatitis C. Sexually transmitted infections (STIs) You should be screened each year for STIs, including gonorrhea and chlamydia, if: You are sexually active and are younger than 78 years of age. You are older than 78 years of age and your health care provider tells you that you are at risk for this type of infection. Your sexual activity has changed since  you were last screened, and you are at increased risk for chlamydia or gonorrhea. Ask your health care provider if you are at risk. Ask your health care provider about whether you are at high risk for HIV. Your health care provider may recommend a prescription medicine to help prevent HIV infection. If you choose to take medicine to prevent HIV, you should first get tested for HIV. You should then be tested every 3 months for as long as you are taking the medicine. Follow these instructions at home: Alcohol use Do not drink alcohol if your health care provider tells you not to drink. If you drink alcohol: Limit how much you have to 0-2 drinks a day. Know how much alcohol is in your drink. In the [place] one drink equals one 12 oz bottle of beer (355 mL), one 5 oz glass of wine (148 mL), or one 1 oz glass of hard liquor (44 mL). Lifestyle Do not use any products that contain nicotine or tobacco. These products include cigarettes, chewing tobacco, and vaping devices, such as e-cigarettes. If you need help quitting, ask your health care provider. Do not use street drugs. Do not  share needles. Ask your health care provider for help if you need support or information about quitting drugs. General instructions Schedule regular health, dental, and eye exams. Stay current with your vaccines. Tell your health care provider if: You often feel depressed. You have ever been abused or do not feel safe at home. Summary Adopting a healthy lifestyle and getting preventive care are important in promoting health and wellness. Follow your health care provider's instructions about healthy diet, exercising, and getting tested or screened for diseases. Follow your health care provider's instructions on monitoring your cholesterol and blood pressure. This information is not intended to replace advice given to you by your health care provider. Make sure you discuss any questions you have with your health care  provider. Document Revised: 08/09/2020 Document Reviewed: 08/09/2020 Elsevier Patient Education  [place].

## 2022-05-22 NOTE — Progress Notes (Signed)
MEDICARE ANNUAL WELLNESS VISIT  05/22/2022  Telephone Visit Disclaimer This Medicare AWV was conducted by telephone due to national recommendations for restrictions regarding the COVID-19 Pandemic (e.g. social distancing).  I verified, using two identifiers, that I am speaking with Brad Cannon or their authorized healthcare agent. I discussed the limitations, risks, security, and privacy concerns of performing an evaluation and management service by telephone and the potential availability of an in-person appointment in the future. The patient expressed understanding and agreed to proceed.  Location of Patient: Home Location of Provider (nurse):  Provider Home  Subjective:    Brad Cannon is a 77 y.o. male patient of Metheney, Brad Kocher, MD who had a Medicare Annual Wellness Visit today via telephone. Zaylynn is Retired and lives alone. he has 2 children. he reports that he is socially active and does interact with friends/family regularly. he is moderately physically active and enjoys making ship model and working on genealogy.  Patient Care Team: Hali Marry, MD as PCP - General (Family Medicine)     05/22/2022   11:03 AM 05/16/2021    2:12 PM 12/22/2019    1:44 PM 11/26/2018    2:11 PM 09/08/2014    9:57 AM  Advanced Directives  Does Patient Have a Medical Advance Directive? No No No No No  Would patient like information on creating a medical advance directive? No - Patient declined No - Patient declined Yes (MAU/Ambulatory/Procedural Areas - Information given) Yes (MAU/Ambulatory/Procedural Areas - Information given) Yes - Educational materials given    Hospital Utilization Over the Past 12 Months: # of hospitalizations or ER visits: 0 # of surgeries: 0  Review of Systems    Patient reports that his overall health is unchanged compared to last year.  History obtained from chart review and the patient  Patient Reported Readings (BP, Pulse, CBG, Weight,  etc) none  Pain Assessment Pain : No/denies pain     Current Medications & Allergies (verified) Allergies as of 05/22/2022   No Known Allergies      Medication List        Accurate as of May 22, 2022 11:25 AM. If you have any questions, ask your nurse or doctor.          atorvastatin 40 MG tablet Commonly known as: LIPITOR TAKE 1 TABLET EVERY DAY   ipratropium 0.06 % nasal spray Commonly known as: ATROVENT Place 1-2 sprays into both nostrils 2 (two) times daily as needed for rhinitis.   lisinopril 5 MG tablet Commonly known as: ZESTRIL Take 1 tablet (5 mg total) by mouth daily.        History (reviewed): Past Medical History:  Diagnosis Date   AAA (abdominal aortic aneurysm) ([place])    Hepatitis A 1998   with elevated LFT's   Hyperlipidemia    Hypertension    IFG (impaired fasting glucose)    Past Surgical History:  Procedure Laterality Date   arthroscopic surgery  1982   left knee   Family History  Problem Relation Age of Onset   Hypertension Mother    Hyperlipidemia Mother    Heart disease Father    Hyperlipidemia Father    Hypertension Father    Hypertension Sister    Hyperlipidemia Sister    Hyperlipidemia Brother    Hypertension Brother    Heart disease Brother    Social History   Socioeconomic History   Marital status: Widowed    Spouse name: Brad Cannon   Number of  children: 2   Years of education: 64   Highest education level: Master's degree (e.g., MA, MS, MEng, MEd, MSW, MBA)  Occupational History   Occupation: Teacher, 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 His wife  passed away in 2021/12/25. He has two children. He enjoys making ship model.   Social Determinants of Health   Financial Resource Strain: Low Risk  (05/22/2022)   Overall Financial Resource Strain (CARDIA)    Difficulty of Paying Living Expenses: Not hard at all  Food Insecurity: No Food Insecurity (05/22/2022)   Hunger Vital Sign    Worried About Running Out of Food in the Last Year: Never true    Ran Out of Food in the Last Year: Never true  Transportation Needs: No Transportation Needs (05/22/2022)   PRAPARE - Hydrologist (Medical): No    Lack of Transportation (Non-Medical): No  Physical Activity: Sufficiently Active (05/22/2022)   Exercise Vital Sign    Days of Exercise per Week: 5 days    Minutes of Exercise per Session: 120 min  Stress: No Stress Concern Present (05/22/2022)   [place]    Feeling of Stress : Only a little  Social Connections: Moderately Isolated (05/22/2022)   Social Connection and Isolation Panel [NHANES]    Frequency of Communication with Friends and Family: Twice a week    Frequency of Social Gatherings with Friends and Family: Twice a week    Attends Religious Services: More than 4 times per year    Active Member of [employer] or Organizations: No    Attends Archivist Meetings: Never    Marital Status: Widowed    Activities of Daily Living    05/22/2022   11:11 AM  In your present state of health, do you have any difficulty performing the following activities:  Hearing? 0  Vision? 0  Difficulty concentrating or making decisions? 0  Walking or climbing stairs? 0  Dressing or bathing? 0  Doing errands, shopping? 0  Preparing Food and eating ? N  Using the Toilet? N  In the past six months, have you accidently leaked urine? N  Do you have problems with loss of bowel control? N  Managing your Medications? N  Managing your Finances? N   Housekeeping or managing your Housekeeping? N    Patient Education/ Literacy How often do you need to have someone help you when you read instructions, pamphlets, or other written materials from your doctor or pharmacy?: 1 - Never What is the last grade level you completed in school?: Masters degree  Exercise Current Exercise Habits: Home exercise routine, Type of exercise: walking;Other - see comments (swimming), Time (Minutes): > 60, Frequency (Times/Week): 5, Weekly Exercise (Minutes/Week): 0, Intensity: Moderate, Exercise limited by: None identified  Diet Patient reports consuming 3 meals a day and 2 snack(s) a day Patient reports that his primary diet is: Regular Patient reports that she does have regular access to food.  Depression Screen    05/22/2022   11:08 AM 05/16/2021    2:06 PM 04/07/2021   10:00 AM 12/22/2019    1:44 PM 03/31/2019    8:38 AM 11/26/2018    2:15 PM 03/21/2018    8:52 AM  PHQ 2/9 Scores  PHQ - 2 Score 0 0 0 0 0 0 0     Fall Risk    05/22/2022   11:11 AM 04/13/2022    8:17 AM 04/13/2022    8:16 AM 05/16/2021    2:06 PM 04/07/2021    9:59 AM  Fall Risk   Falls in the past year? 0 0 0 0 0  Number falls in past yr: 0 0 0 0 0  Injury with Fall? 0 0 0 0 0  Risk for fall due to : No Fall Risks No Fall Risks No Fall Risks No Fall Risks No Fall Risks  Follow up Falls evaluation completed Falls evaluation completed Falls evaluation completed Falls evaluation completed Falls prevention discussed;Falls evaluation completed     Objective:  INDIO SAMP seemed alert and oriented and he participated appropriately during our telephone visit.  Blood Pressure Weight BMI  BP Readings from Last 3 Encounters:  04/13/22 (!) 143/82  01/23/22 125/72  10/11/21 132/68   Wt Readings from Last 3 Encounters:  04/13/22 159 lb (72.1 kg)  01/23/22 155 lb (70.3 kg)  10/11/21 160 lb (72.6 kg)   BMI Readings from Last 1 Encounters:  04/13/22 25.66 kg/m    *Unable to  obtain current vital signs, weight, and BMI due to telephone visit type  Hearing/Vision  Kito did not seem to have difficulty with hearing/understanding during the telephone conversation Reports that he has not had a formal eye exam by an eye care professional within the past year Reports that he has had a formal hearing evaluation within the past year *Unable to fully assess hearing and vision during telephone visit type  Cognitive Function:    05/22/2022   11:13 AM 05/16/2021    2:11 PM 12/22/2019    1:46 PM 11/26/2018    2:18 PM  6CIT Screen  What Year? 0 points 0 points  0 points  What month? 0 points 0 points  0 points  What time? 0 points 0 points 0 points 0 points  Count back from 20 0 points 0 points 0 points 0 points  Months in reverse 0 points 0 points 0 points 0 points  Repeat phrase 0 points 0 points 0 points 0 points  Total Score 0 points 0 points  0 points   (Normal:0-7, Significant for Dysfunction: >8)  Normal Cognitive Function Screening: Yes   Immunization & Health Maintenance Record Immunization History  Administered Date(s) Administered   Fluad Quad(high Dose 65+) 12/17/2019, 11/30/2020   Influenza Split 01/01/2012   Influenza Whole 02/21/2006   Influenza, High Dose Seasonal PF 01/23/2017, 01/22/2018, 01/15/2019   Influenza,inj,Quad PF,6+ Mos 12/25/2013, 01/22/2018, 12/21/2021   Influenza-Unspecified 09[birth year]22[birth year], 01/15/2013, 01/12/2015, 02/03/2016   Moderna Sars-Covid-2 Vaccination 06/02/2019, 06/30/2019, 01/30/2020   Pneumococcal Conjugate-13 02/18/2014   Pneumococcal Polysaccharide-23 11/28/2011   Td 02/21/2006   Tdap 04/18/2016   Zoster Recombinat (Shingrix) 10/11/2020, 01/17/2021   Zoster, Live 10/09/2008    Health Maintenance  Topic Date Due   COVID-19 Vaccine (4 - 2023-24 season) 02/09/2023 (Originally 12/02/2021)   Medicare Annual Wellness (AWV)  05/23/2023   DTaP/Tdap/Td (3 - Td or Tdap) 04/18/2026   Pneumonia Vaccine 64+ Years old  Completed    INFLUENZA  VACCINE  Completed   Hepatitis C Screening  Completed   Zoster Vaccines- Shingrix  Completed   HPV VACCINES  Aged Out   COLONOSCOPY (Pts 45-7yr Insurance coverage will need to be confirmed)  Discontinued       Assessment  This is a routine wellness examination for [employer]  Health Maintenance: Due or Overdue There are no preventive care reminders to display for this patient.   RHarrel CarinaThibeault does not need a referral for CCommercial Metals CompanyAssistance: Care Management:   no Social Work:    no Prescription Assistance:  no Nutrition/Diabetes Education:  no   Plan:  Personalized Goals  Goals Addressed               This Visit's Progress     Patient Stated (pt-stated)        Patient stated that he would like to continue his healthy lifestyle.       Personalized Health Maintenance & Screening Recommendations  There are no preventive care reminders to display for this patient.  Lung Cancer Screening Recommended: no (Low Dose CT Chest recommended if Age 78-80years, 30 pack-year currently smoking OR have quit w/in past 15 years) Hepatitis C Screening recommended: no HIV Screening recommended: no  Advanced Directives: Written information was not prepared per patient's request.  Referrals & Orders No orders of the defined types were placed in this encounter.   Follow-up Plan Follow-up with MHali Marry MD as planned Medicare wellness visit in one year AVS printed and mailed to the patient.   I have personally reviewed and noted the following in the patient's chart:   Medical and social history Use of alcohol, tobacco or illicit drugs  Current medications and supplements Functional ability and status Nutritional status Physical activity Advanced directives List of other physicians Hospitalizations, surgeries, and ER visits in previous 12 months Vitals Screenings to include cognitive, depression, and falls Referrals and  appointments  In addition, I have reviewed and discussed with RDelfin Ediscertain preventive protocols, quality metrics, and best practice recommendations. A written personalized care plan for preventive services as well as general preventive health recommendations is available and can be mailed to the patient at his request.      BTinnie Gens RN BSN  05/22/2022

## 2022-06-01 ENCOUNTER — Ambulatory Visit (INDEPENDENT_AMBULATORY_CARE_PROVIDER_SITE_OTHER): Payer: Medicare HMO

## 2022-06-01 ENCOUNTER — Encounter: Payer: Self-pay | Admitting: Family Medicine

## 2022-06-01 ENCOUNTER — Ambulatory Visit (INDEPENDENT_AMBULATORY_CARE_PROVIDER_SITE_OTHER): Payer: Medicare HMO | Admitting: Family Medicine

## 2022-06-01 VITALS — BP 132/83 | HR 68 | Ht 66.0 in | Wt 158.0 lb

## 2022-06-01 DIAGNOSIS — I10 Essential (primary) hypertension: Secondary | ICD-10-CM

## 2022-06-01 DIAGNOSIS — M25541 Pain in joints of right hand: Secondary | ICD-10-CM

## 2022-06-01 DIAGNOSIS — G540 Brachial plexus disorders: Secondary | ICD-10-CM | POA: Diagnosis not present

## 2022-06-01 DIAGNOSIS — M79601 Pain in right arm: Secondary | ICD-10-CM

## 2022-06-01 DIAGNOSIS — M7989 Other specified soft tissue disorders: Secondary | ICD-10-CM

## 2022-06-01 MED ORDER — NAPROXEN 500 MG PO TABS: 500.0000 mg | ORAL_TABLET | Freq: Two times a day (BID) | ORAL | 1 refills | Status: DC

## 2022-06-01 NOTE — Progress Notes (Signed)
Acute Office Visit  Subjective:     Patient ID: Brad Cannon, male    DOB: 1945/09/22, 77 y.o.   MRN: [id number]  Chief Complaint  Patient presents with   Arm Pain    Pt reports R forearm he said that he had been playing games on the computer and feels that may have aggravated this. He stopped computer games about 2 weeks ago. He also noticed that his R little finger is swollen.     HPI  Patient is in today for right forearm pain.  He says he noticed the pain started about 2 to 3 weeks ago.  No specific injury or trauma.  He does swim regularly for exercise.  Its mostly over the anterior forearm.  But also noticed that the DIP joint on his fifth finger on his right hand has also been swollen and tender for about 3 weeks.  He says the pain in the forearm and the pinky was more intense 3 weeks ago and has lessened but it still present every day.  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  He never ended up having surgery but ended up doing anti-inflammatories for probably about 5 to 6 weeks and it eventually eased off and went away.  He denies any other joints being involved in swelling or redness or tenderness.  Blood pressures have been elevated recently.  Sometimes in the 140s at home as well as here.  He wonders if he should possibly go up on his lisinopril to 10 mg.  He would really like to lose about about 5 more lbs.    ROS      Objective:    BP 132/83   Pulse 68   Ht '5\' 6"'$  (1.676 m)   Wt 158 lb (71.7 kg)   SpO2 98%   BMI 25.50 kg/m    Physical Exam Constitutional:      Appearance: He is well-developed.  HENT:     Head: Normocephalic and atraumatic.  Cardiovascular:     Rate and Rhythm: Normal rate and regular rhythm.     Heart sounds: Normal heart sounds.  Pulmonary:     Effort: Pulmonary  effort is normal.     Breath sounds: Normal breath sounds.  Musculoskeletal:     Comments: Right elbow wrist and fingers with normal motion.  Nontender over the lateral medial epicondyles.  Nontender over the wrist.  Radial pulse 2+.  Negative Tennille and Phalen sign.  Negative Finkelstein  Skin:    General: Skin is warm and dry.  Neurological:     Mental Status: He is alert and oriented to person, place, and time.  Psychiatric:        Behavior: Behavior normal.     No results found for any visits on 06/01/22.      Assessment & Plan:   Problem List Items Addressed This Visit       Cardiovascular and Mediastinum   HYPERTENSION, BENIGN ESSENTIAL    Well controlled today.  Ok to increase lisinopril to '10mg'$  daily and monitor BP/.         Nervous and Auditory   THORACIC OUTLET SYNDROME   Other Visit Diagnoses     Right arm pain    -  Primary   Relevant Medications   naproxen (  NAPROSYN) 500 MG tablet   Arthralgia of right hand       Relevant Orders   DG Finger Little Right      Right arm pain-could be recurrence of that thoracic outlet syndrome that he had years ago he says it feels like that and he did get better with several weeks of and NSAIDs for think it is reasonable to start there.  He otherwise has normal range of motion of his right shoulder and hand.  No weakness.  If not improving we can consider formal physical therapy.  He does have tenderness and swelling of the DIP joint on his fifth finger on his right hand it has been there for 3 weeks.  Could be synovitis but does not seem to be resolving on its own.  No injury or trauma.  Recommend x-ray for further workup.    Meds ordered this encounter  Medications   naproxen (NAPROSYN) 500 MG tablet    Sig: Take 1 tablet (500 mg total) by mouth 2 (two) times daily with a meal.    Dispense:  60 tablet    Refill:  1    Return if symptoms worsen or fail to improve.  Beatrice Lecher, MD

## 2022-06-01 NOTE — Assessment & Plan Note (Addendum)
Well controlled today.  Ok to increase lisinopril to '10mg'$  daily and monitor BP/.

## 2022-06-01 NOTE — Patient Instructions (Signed)
Ok to go up to '10mg'$  on lisinopril.

## 2022-06-05 NOTE — Progress Notes (Signed)
HI Ron, you do have some marginal erosions on that finger.  They felt like it could be from either gout, psoriatic arthritis or other type of arthropathy.  I would like to check a couple of extra labs to look at your uric acid level and for inflammatory markers for other types of arthritis.  If you are okay with that then please let me know and I will be happy to put the orders in and you can come anytime to have it drawn.

## 2022-06-08 ENCOUNTER — Other Ambulatory Visit: Payer: Self-pay | Admitting: *Deleted

## 2022-06-08 DIAGNOSIS — M2540 Effusion, unspecified joint: Secondary | ICD-10-CM

## 2022-06-08 DIAGNOSIS — R9389 Abnormal findings on diagnostic imaging of other specified body structures: Secondary | ICD-10-CM

## 2022-06-13 DIAGNOSIS — M2540 Effusion, unspecified joint: Secondary | ICD-10-CM | POA: Diagnosis not present

## 2022-06-13 DIAGNOSIS — R9389 Abnormal findings on diagnostic imaging of other specified body structures: Secondary | ICD-10-CM | POA: Diagnosis not present

## 2022-06-15 LAB — SEDIMENTATION RATE: Sed Rate: 2 mm/h (ref 0–20)

## 2022-06-15 LAB — URIC ACID: Uric Acid, Serum: 6.4 mg/dL (ref 4.0–8.0)

## 2022-06-15 LAB — ANA: Anti Nuclear Antibody (ANA): NEGATIVE

## 2022-06-15 LAB — CYCLIC CITRUL PEPTIDE ANTIBODY, IGG: Cyclic Citrullin Peptide Ab: <16 UNITS

## 2022-06-16 NOTE — Progress Notes (Signed)
Call patient No sign of autoimmune joint disorders such as rheumatoid.  No sign of lupus.  No sign of gout.  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

## 2022-06-19 ENCOUNTER — Ambulatory Visit (INDEPENDENT_AMBULATORY_CARE_PROVIDER_SITE_OTHER): Payer: Medicare HMO | Admitting: Physician Assistant

## 2022-06-19 VITALS — BP 128/84 | HR 61 | Ht 66.0 in | Wt 153.0 lb

## 2022-06-19 DIAGNOSIS — I10 Essential (primary) hypertension: Secondary | ICD-10-CM | POA: Diagnosis not present

## 2022-06-19 DIAGNOSIS — K5900 Constipation, unspecified: Secondary | ICD-10-CM | POA: Diagnosis not present

## 2022-06-19 MED ORDER — LISINOPRIL 10 MG PO TABS: 10.0000 mg | ORAL_TABLET | Freq: Every day | ORAL | 1 refills | Status: DC

## 2022-06-19 NOTE — Progress Notes (Unsigned)
Acute Office Visit  Subjective:     Patient ID: Brad Cannon, male    DOB: Oct 23, 1945, 77 y.o.   MRN: [id number]  Chief Complaint  Patient presents with   Constipation    HPI Patient is in today for hard, straining bowel movements. 2 weeks ago he start naproxen for some right arm pain. While on naproxen he started having very loose stools multiple times a day. He took immodium once and has not had good bowel movements since. BMs have been hard with straining and do not feel full. No fever, chills, melena, or hematochezia. No abdominal or flank pain. No urinary symptoms. Last colonoscopy 04/2017.   Pt needs refills on lisinopril. PCP increased to 10mg (2 5mg ) tablets at last visit due to elevated BP. He has had no problems with increase but is running out of medication. Checking bP at home and ranging below 130/80. No CP, palpitations, headaches, or vision changes.   .. 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[facility]) 09/26/2018   Rhinitis 04/07/2020   Grief 01/24/2022   Constipation 06/20/2022   Resolved Ambulatory Problems    Diagnosis Date Noted   THUMB PAIN, LEFT 07/28/2009   Impaired fasting glucose 11/29/2007   INJURY OTHER AND UNSPECIFIED FINGER 07/28/2009   Toxic effect of venom(989.5) 10/02/2009   SHOULDER PAIN, RIGHT 05/11/2010   ABRASION/FRICION BURN OTH MX&UNS SITE W/O INF 05/11/2010   Cerumen impaction 05/06/2014   Left groin pain 01/24/2022   Past Medical History:  Diagnosis Date   AAA (abdominal aortic aneurysm) ([facility])    Hepatitis A 1998   Hyperlipidemia    Hypertension      ROS See HPI>      Objective:    BP 128/84    Pulse 61   Ht 5\' 6"  (1.676 m)   Wt 153 lb (69.4 kg)   SpO2 98%   BMI 24.69 kg/m  BP Readings from Last 3 Encounters:  06/19/22 128/84  06/01/22 132/83  04/13/22 (!) 143/82   Wt Readings from Last 3 Encounters:  06/19/22 153 lb (69.4 kg)  06/01/22 158 lb (71.7 kg)  04/13/22 159 lb (72.1 kg)      Physical Exam Constitutional:      Appearance: Normal appearance.  HENT:     Head: Normocephalic.  Cardiovascular:     Rate and Rhythm: Normal rate and regular rhythm.  Pulmonary:     Effort: Pulmonary effort is normal.     Breath sounds: Normal breath sounds.  Abdominal:     General: Bowel sounds are normal. There is no distension.     Palpations: Abdomen is soft. There is no mass.     Tenderness: There is abdominal tenderness. There is no right CVA tenderness, left CVA tenderness, guarding or rebound.     Hernia: No hernia is present.     Comments: Very very mild discomfort over bilateral middle abdomen. No guarding or rebound.   Musculoskeletal:     Right lower leg: No edema.     Left lower leg: No edema.  Neurological:     General: No focal deficit present.     Mental Status: He is alert and oriented to  person, place, and time.  Psychiatric:        Mood and Affect: Mood normal.          Assessment & Plan:  Marland KitchenMarland KitchenAnthonee was seen today for constipation.  Diagnoses and all orders for this visit:  Constipation, unspecified constipation type  HYPERTENSION, BENIGN ESSENTIAL -     lisinopril (ZESTRIL) 10 MG tablet; Take 1 tablet (10 mg total) by mouth daily.   BP looks great Refilled lisinopril   Pt is having some constipation with no red flags Pt is UTD on colonoscopy Discussed constipation diet, HO given Start stool softener colace and probiotic(align or culturelle)  If not improving in the next few days add miralax 1 capful in 4oz of juice Stay hydrated and drink plenty of water Follow up as needed or if symptoms worsen or change    Iran Planas, PA-C

## 2022-06-19 NOTE — Patient Instructions (Addendum)
7-14 days of probiotic like Culturette or align  Stool softener colace over the counter  If not improving consider Miralax 1 capful in 4oz of juice daily until stools moving well again  Constipation, Adult Constipation is when a person has fewer than three bowel movements in a week, has difficulty having a bowel movement, or has stools (feces) that are dry, hard, or larger than normal. Constipation may be caused by an underlying condition. It may become worse with age if a person takes certain medicines and does not take in enough fluids. Follow these instructions at home: Eating and drinking  Eat foods that have a lot of fiber, such as beans, whole grains, and fresh fruits and vegetables. Limit foods that are low in fiber and high in fat and processed sugars, such as fried or sweet foods. These include french fries, hamburgers, cookies, candies, and soda. Drink enough fluid to keep your urine pale yellow. General instructions Exercise regularly or as told by your health care provider. Try to do 150 minutes of moderate exercise each week. Use the bathroom when you have the urge to go. Do not hold it in. Take over-the-counter and prescription medicines only as told by your health care provider. This includes any fiber supplements. During bowel movements: Practice deep breathing while relaxing the lower abdomen. Practice pelvic floor relaxation. Watch your condition for any changes. Let your health care provider know about them. Keep all follow-up visits as told by your health care provider. This is important. Contact a health care provider if: You have pain that gets worse. You have a fever. You do not have a bowel movement after 4 days. You vomit. You are not hungry or you lose weight. You are bleeding from the opening between the buttocks (anus). You have thin, pencil-like stools. Get help right away if: You have a fever and your symptoms suddenly get worse. You leak stool or have  blood in your stool. Your abdomen is bloated. You have severe pain in your abdomen. You feel dizzy or you faint. Summary Constipation is when a person has fewer than three bowel movements in a week, has difficulty having a bowel movement, or has stools (feces) that are dry, hard, or larger than normal. Eat foods that have a lot of fiber, such as beans, whole grains, and fresh fruits and vegetables. Drink enough fluid to keep your urine pale yellow. Take over-the-counter and prescription medicines only as told by your health care provider. This includes any fiber supplements. This information is not intended to replace advice given to you by your health care provider. Make sure you discuss any questions you have with your health care provider. Document Revised: 02/01/2022 Document Reviewed: 02/01/2022 Elsevier Patient Education  [place].

## 2022-06-20 ENCOUNTER — Encounter: Payer: Self-pay | Admitting: Physician Assistant

## 2022-06-20 DIAGNOSIS — K5900 Constipation, unspecified: Secondary | ICD-10-CM | POA: Insufficient documentation

## 2022-09-05 ENCOUNTER — Ambulatory Visit: Payer: Medicare HMO

## 2022-10-10 ENCOUNTER — Ambulatory Visit: Payer: Medicare HMO | Admitting: Family Medicine

## 2022-10-12 ENCOUNTER — Ambulatory Visit: Payer: Medicare HMO | Admitting: Family Medicine

## 2022-10-19 ENCOUNTER — Ambulatory Visit: Payer: Medicare HMO | Admitting: Family Medicine

## 2022-10-25 NOTE — Progress Notes (Unsigned)
   Established Patient Office Visit  Subjective   Patient ID: Brad Cannon, male    DOB: 10-30-1945  Age: 77 y.o. MRN: [id number]  No chief complaint on file.   HPI Hypertension- Pt denies chest pain, SOB, dizziness, or heart palpitations.  Taking meds as directed w/o problems.  Denies medication side effects.    {History (Optional):23778}  ROS    Objective:     There were no vitals taken for this visit. {Vitals History (Optional):23777}  Physical Exam   No results found for any visits on 10/26/22.  {Labs (Optional):23779}  The ASCVD Risk score (Arnett DK, et al., 2019) failed to calculate for the following reasons:   The valid total cholesterol range is 130 to 320 mg/dL    Assessment & Plan:   Problem List Items Addressed This Visit       Cardiovascular and Mediastinum   HYPERTENSION, BENIGN ESSENTIAL - Primary    No follow-ups on file.    Nani Gasser, MD

## 2022-10-26 ENCOUNTER — Encounter: Payer: Self-pay | Admitting: Family Medicine

## 2022-10-26 ENCOUNTER — Ambulatory Visit (INDEPENDENT_AMBULATORY_CARE_PROVIDER_SITE_OTHER): Payer: Medicare HMO | Admitting: Family Medicine

## 2022-10-26 VITALS — BP 124/68 | HR 67 | Resp 17 | Ht 66.0 in | Wt 155.0 lb

## 2022-10-26 DIAGNOSIS — E785 Hyperlipidemia, unspecified: Secondary | ICD-10-CM

## 2022-10-26 DIAGNOSIS — I10 Essential (primary) hypertension: Secondary | ICD-10-CM | POA: Diagnosis not present

## 2022-10-26 DIAGNOSIS — B351 Tinea unguium: Secondary | ICD-10-CM

## 2022-10-26 DIAGNOSIS — R2231 Localized swelling, mass and lump, right upper limb: Secondary | ICD-10-CM | POA: Diagnosis not present

## 2022-10-26 MED ORDER — TERBINAFINE HCL 250 MG PO TABS: 250.0000 mg | ORAL_TABLET | Freq: Every day | ORAL | 1 refills | Status: DC

## 2022-10-26 NOTE — Assessment & Plan Note (Signed)
Tolerating atorvastatin well.  Labs are up-to-date.

## 2022-10-26 NOTE — Assessment & Plan Note (Signed)
Well controlled. Continue current regimen. Follow up in  6 mo  

## 2022-10-27 NOTE — Progress Notes (Signed)
Hi Ron, your metabolic panel looks great except the AST is slightly elevated.  Just decrease alcohol intake please.  Follow-up in 6 months.

## 2022-11-17 ENCOUNTER — Other Ambulatory Visit: Payer: Self-pay | Admitting: Physician Assistant

## 2022-11-17 DIAGNOSIS — I10 Essential (primary) hypertension: Secondary | ICD-10-CM

## 2022-11-23 ENCOUNTER — Encounter: Payer: Self-pay | Admitting: Family Medicine

## 2022-11-23 ENCOUNTER — Ambulatory Visit (INDEPENDENT_AMBULATORY_CARE_PROVIDER_SITE_OTHER): Payer: Medicare HMO | Admitting: Family Medicine

## 2022-11-23 VITALS — BP 121/68 | HR 61 | Ht 66.0 in | Wt 155.0 lb

## 2022-11-23 DIAGNOSIS — R21 Rash and other nonspecific skin eruption: Secondary | ICD-10-CM

## 2022-11-23 DIAGNOSIS — Z23 Encounter for immunization: Secondary | ICD-10-CM | POA: Diagnosis not present

## 2022-11-23 NOTE — Patient Instructions (Signed)
Recommend either Claritin, Zyrtec or Allegra.  Okay to take tab twice a day if needed.

## 2022-11-23 NOTE — Progress Notes (Signed)
   Acute Office Visit  Subjective:     Patient ID: Brad Cannon, male    DOB: [birth year], 77 y.o.   MRN: [id number]  Chief Complaint  Patient presents with   Rash    HPI Patient is in today for rash.  Started a couple of days ago.  It just seems to be moving around but it is very itchy when it breaks out but then it can clear up.  He says the only thing different that he can think is that he is being swimming in a different gym and so has been showering they are using their soap and towels over the last week or so.  But no other major changes.  No fevers chills.  No shortness of breath or chest pain.  ROS      Objective:    BP 121/68   Pulse 61   Ht 5\' 6"  (1.676 m)   Wt 155 lb (70.3 kg)   SpO2 97%   BMI 25.02 kg/m    Physical Exam Vitals reviewed.  Constitutional:      Appearance: Normal appearance.  HENT:     Head: Normocephalic.  Pulmonary:     Effort: Pulmonary effort is normal.  Skin:    Comments: Erythematous papules and streaks scattered on the skin on his forearms and neck.  Has a itchy spot on his leg right now.   Neurological:     Mental Status: He is alert and oriented to person, place, and time.  Psychiatric:        Mood and Affect: Mood normal.        Behavior: Behavior normal.     No results found for any visits on 11/23/22.      Assessment & Plan:   Problem List Items Addressed This Visit   None Visit Diagnoses     Rash    -  Primary      I do think he is having an allergic reaction to something.  I think certainly starting with the soap and the towels and bring his own soap and his own towels from home.  Also looking at other products that he might be using routinely such as fabric softeners detergents etc. to see if anything has changed recently.  In the interim recommend an oral antihistamine once or twice daily.  Did warn about potential for sedation and dry mouth.  Also can use a topical steroid cream just on the areas that are  particularly itchy as well as cool compresses.  He says he thinks he actually has a cream at home but will call as it is and let us know if we need to send in something different.  No orders of the defined types were placed in this encounter.   No follow-ups on file.  Nani Gasser, MD

## 2023-01-17 ENCOUNTER — Other Ambulatory Visit: Payer: Self-pay | Admitting: Family Medicine

## 2023-02-23 ENCOUNTER — Encounter: Payer: Self-pay | Admitting: Physician Assistant

## 2023-02-23 ENCOUNTER — Ambulatory Visit (INDEPENDENT_AMBULATORY_CARE_PROVIDER_SITE_OTHER): Payer: Medicare HMO | Admitting: Physician Assistant

## 2023-02-23 VITALS — BP 137/71 | HR 65 | Ht 66.0 in | Wt 151.0 lb

## 2023-02-23 DIAGNOSIS — K2960 Other gastritis without bleeding: Secondary | ICD-10-CM | POA: Diagnosis not present

## 2023-02-23 DIAGNOSIS — R1013 Epigastric pain: Secondary | ICD-10-CM | POA: Diagnosis not present

## 2023-02-23 DIAGNOSIS — R109 Unspecified abdominal pain: Secondary | ICD-10-CM | POA: Insufficient documentation

## 2023-02-23 DIAGNOSIS — R110 Nausea: Secondary | ICD-10-CM | POA: Insufficient documentation

## 2023-02-23 MED ORDER — OMEPRAZOLE 40 MG PO CPDR: 40.0000 mg | DELAYED_RELEASE_CAPSULE | Freq: Every day | ORAL | 1 refills | Status: DC

## 2023-02-23 NOTE — Progress Notes (Signed)
Acute Office Visit  Subjective:     Patient ID: Brad Cannon, male    DOB: 01/12/1946, 77 y.o.   MRN: [id number]  Chief Complaint  Patient presents with   Medical Management of Chronic Issues    Heartburn onset 1 week pt states he consumes apple juice and spicy foods     HPI Patient is in today for new heartburn and reflux for the last 2 weeks. His diet has not changed at all but admits to eating acidic foods. He denies any medication changes. He denies any increase in alcohol. He drinks a few beers and 1 scotch a week. He denies any melena or hematochezia. He has not had any abdominal pain but has had nausea and one night of right flank pain. Denies any urinary symptoms. Not tried anything to make better and nothing makes worse.   .. 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HCC) 09/26/2018   Rhinitis 04/07/2020   Grief 01/24/2022   Constipation 06/20/2022   Right flank pain 02/23/2023   Reflux gastritis 02/23/2023   Epigastric pain 02/23/2023   Nausea 02/23/2023   Resolved Ambulatory Problems    Diagnosis Date Noted   THUMB PAIN, LEFT 07/28/2009   Impaired fasting glucose 11/29/2007   INJURY OTHER AND UNSPECIFIED FINGER 07/28/2009   Toxic effect of venom 10/02/2009   SHOULDER PAIN, RIGHT 05/11/2010   ABRASION/FRICION BURN OTH MX&UNS SITE W/O INF 05/11/2010   Cerumen impaction 05/06/2014   Left groin pain 01/24/2022   Past Medical History:  Diagnosis Date   AAA (abdominal aortic aneurysm) (HCC)    Hepatitis A 1998   Hyperlipidemia    Hypertension      ROS See HPI.      Objective:    BP 137/71   Pulse 65   Ht 5\' 6"  (1.676  m)   Wt 151 lb (68.5 kg)   SpO2 99%   BMI 24.37 kg/m  BP Readings from Last 3 Encounters:  02/23/23 137/71  11/23/22 121/68  10/26/22 124/68   Wt Readings from Last 3 Encounters:  02/23/23 151 lb (68.5 kg)  11/23/22 155 lb (70.3 kg)  10/26/22 155 lb (70.3 kg)      Physical Exam Constitutional:      Appearance: Normal appearance.  HENT:     Head: Normocephalic.  Cardiovascular:     Rate and Rhythm: Normal rate and regular rhythm.  Pulmonary:     Effort: Pulmonary effort is normal.  Abdominal:     General: There is no distension.     Palpations: Abdomen is soft. There is no mass.     Tenderness: There is no abdominal tenderness. There is no right CVA tenderness, left CVA tenderness, guarding or rebound.     Hernia: No hernia is present.  Neurological:     General: No focal deficit present.     Mental Status: He is alert and oriented to person, place, and time.  Psychiatric:        Mood and Affect: Mood normal.           Assessment & Plan:  Marland KitchenMarland KitchenChristianjacob was seen today for medical management of chronic issues.  Diagnoses and  all orders for this visit:  Epigastric pain -     H. pylori breath test -     CMP14+EGFR -     CBC w/Diff/Platelet -     Lipase -     omeprazole (PRILOSEC) 40 MG capsule; Take 1 capsule (40 mg total) by mouth daily.  Nausea -     H. pylori breath test -     CMP14+EGFR -     CBC w/Diff/Platelet -     Lipase -     omeprazole (PRILOSEC) 40 MG capsule; Take 1 capsule (40 mg total) by mouth daily.  Reflux gastritis -     H. pylori breath test -     CMP14+EGFR -     CBC w/Diff/Platelet -     Lipase -     omeprazole (PRILOSEC) 40 MG capsule; Take 1 capsule (40 mg total) by mouth daily.  Right flank pain -     omeprazole (PRILOSEC) 40 MG capsule; Take 1 capsule (40 mg total) by mouth daily.   No red flag symptoms today No clear cause of symptoms today Will test for h.pylori with breath test Will get labs to look for signs of  pancreatitis, cholecystitis, acute blood loss.  Start omeprazole for 4-6 weeks and follow up to check on symptoms HO on gastritis given today    Return in about 6 weeks (around 04/06/2023) for Follow up.  Tandy Gaw, PA-C

## 2023-02-23 NOTE — Patient Instructions (Signed)
Gastritis, Adult Gastritis is inflammation of the stomach. There are two kinds of gastritis: Acute gastritis. This kind develops suddenly. Chronic gastritis. This kind is much more common. It develops slowly and lasts for a long time. Gastritis happens when the lining of the stomach becomes weak or gets damaged. Without treatment, gastritis can lead to stomach bleeding and ulcers. What are the causes? This condition may be caused by: An infection. Drinking too much alcohol. Certain medicines. These include steroids, antibiotics, and some over-the-counter medicines, such as aspirin or ibuprofen. Having too much acid in the stomach. Having a disease of the stomach. Other causes may include: An allergic reaction. Some cancer treatments (radiation). Smoking cigarettes or the use of products that contain nicotine or tobacco. In some cases, the cause of this condition is not known. What increases the risk? Having a disease of the intestines. Having a disease in which the body's immune system attacks the body (autoimmune disease), such as Crohn's disease. Using aspirin or ibuprofen and other NSAIDs to treat other conditions, such as heart disease or chronic pain. Stress. What are the signs or symptoms? Symptoms of this condition include: Pain or a burning sensation in the upper abdomen. Nausea. Vomiting. An uncomfortable feeling of fullness after eating. Weight loss. Bad breath. Blood in your vomit or stool (feces). In some cases, there are no symptoms. How is this diagnosed? This condition may be diagnosed based on your medical history, a physical exam, and tests. Tests may include: Your medical history and a description of your symptoms. A physical exam. Tests. These can include: Blood tests. Stool tests. A test in which a thin, flexible instrument with a light and a camera is passed down the esophagus and into the stomach (upper endoscopy). A test in which a tissue sample is  removed to look at it under a microscope (biopsy). How is this treated? This condition may be treated with medicines. The medicines that are used vary depending on the cause of the gastritis. If the condition is caused by a bacterial infection, you may be given antibiotic medicines. If the condition is caused by too much acid in the stomach, you may be given medicines called H2 blockers, proton pump inhibitors, or antacids. Treatment may also involve stopping the use of certain medicines such as aspirin or ibuprofen and other NSAIDs. Follow these instructions at home: Medicines Take over-the-counter and prescription medicines only as told by your health care provider. If you were prescribed an antibiotic medicine, take it as told by your health care provider. Do not stop taking the antibiotic even if you start to feel better. Alcohol use Do not drink alcohol if: Your health care provider tells you not to drink. You are pregnant, may be pregnant, or are planning to become pregnant. If you drink alcohol: Limit your use to: 0-1 drink a day for women. 0-2 drinks a day for men. Know how much alcohol is in your drink. In the [place] one drink equals one 12 oz bottle of beer (355 mL), one 5 oz glass of wine (148 mL), or one 1 oz glass of hard liquor (44 mL). General instructions  Eat small, frequent meals instead of large meals. Avoid foods and drinks that make your symptoms worse. Talk with your health care provider about ways to manage stress, such as getting regular exercise or practicing deep breathing, meditation, or yoga. Do not use any products that contain nicotine or tobacco. These products include cigarettes, chewing tobacco, and vaping devices, such as e-cigarettes.  If you need help quitting, ask your health care provider. Drink enough fluid to keep your urine pale yellow. Keep all follow-up visits. This is important. Contact a health care provider if: Your symptoms get worse. Your  abdominal pain gets worse. Your symptoms return after treatment. You have a fever. Get help right away if: You vomit blood or a substance that looks like coffee grounds. You have black or dark red stools. You are unable to keep fluids down. These symptoms may represent a serious problem that is an emergency. Do not wait to see if the symptoms will go away. Get medical help right away. Call your local emergency services (911 in the [place] Do not drive yourself to the hospital. Summary Gastritis is inflammation of the lining of the stomach that can occur suddenly (acute) or develop slowly over time (chronic). This condition is diagnosed with a medical history, a physical exam, or tests. This condition may be treated with medicines to treat infection or medicines to reduce the amount of acid in your stomach. Follow your health care provider's instructions about taking medicines, making changes to your diet, and knowing when to call for help. This information is not intended to replace advice given to you by your health care provider. Make sure you discuss any questions you have with your health care provider. Document Revised: 07/24/2020 Document Reviewed: 07/24/2020 Elsevier Patient Education  2024 [employer].

## 2023-02-25 LAB — CMP14+EGFR
ALT: 17 IU/L (ref 0–44)
AST: 49 [IU]/L — ABNORMAL HIGH (ref 0–40)
Albumin: 4.4 g/dL (ref 3.8–4.8)
Alkaline Phosphatase: 84 [IU]/L (ref 44–121)
BUN/Creatinine Ratio: 20 (ref 10–24)
BUN: 17 mg/dL (ref 8–27)
Bilirubin Total: 0.8 mg/dL (ref 0.0–1.2)
CO2: 20 mmol/L (ref 20–29)
Calcium: 9.5 mg/dL (ref 8.6–10.2)
Chloride: 102 mmol/L (ref 96–106)
Creatinine, Ser: 0.87 mg/dL (ref 0.76–1.27)
Globulin, Total: 2.7 g/dL (ref 1.5–4.5)
Glucose: 100 mg/dL — ABNORMAL HIGH (ref 70–99)
Potassium: 4.8 mmol/L (ref 3.5–5.2)
Sodium: 138 mmol/L (ref 134–144)
Total Protein: 7.1 g/dL (ref 6.0–8.5)
eGFR: 88 mL/min/{1.73_m2} (ref >59)

## 2023-02-25 LAB — H. PYLORI BREATH TEST: H pylori Breath Test: NEGATIVE

## 2023-02-25 LAB — CBC WITH DIFFERENTIAL/PLATELET
Basophils Absolute: 0 10*3/uL (ref 0.0–0.2)
Basos: 1 %
EOS (ABSOLUTE): 0.1 10*3/uL (ref 0.0–0.4)
Eos: 2 %
Hematocrit: 44.5 % (ref 37.5–51.0)
Hemoglobin: 14.8 g/dL (ref 13.0–17.7)
Immature Grans (Abs): 0 10*3/uL (ref 0.0–0.1)
Immature Granulocytes: 0 %
Lymphocytes Absolute: 2.2 10*3/uL (ref 0.7–3.1)
Lymphs: 36 %
MCH: 33.6 pg — ABNORMAL HIGH (ref 26.6–33.0)
MCHC: 33.3 g/dL (ref 31.5–35.7)
MCV: 101 fL — ABNORMAL HIGH (ref 79–97)
Monocytes Absolute: 0.5 10*3/uL (ref 0.1–0.9)
Monocytes: 8 %
Neutrophils Absolute: 3.2 10*3/uL (ref 1.4–7.0)
Neutrophils: 53 %
Platelets: 233 10*3/uL (ref 150–450)
RBC: 4.4 x10E6/uL (ref 4.14–5.80)
RDW: 12.2 % (ref 11.6–15.4)
WBC: 6 10*3/uL (ref 3.4–10.8)

## 2023-02-25 LAB — LIPASE: Lipase: 70 U/L (ref 13–78)

## 2023-02-26 NOTE — Progress Notes (Signed)
Latrel,   H.pylori breath test negative. GREAT news.  AST, liver enzyme, stable.  Kidney function looks good.  Labs support acute gastritis, treatment plan stays the same.

## 2023-03-07 DIAGNOSIS — H903 Sensorineural hearing loss, bilateral: Secondary | ICD-10-CM | POA: Diagnosis not present

## 2023-03-07 DIAGNOSIS — H6123 Impacted cerumen, bilateral: Secondary | ICD-10-CM | POA: Diagnosis not present

## 2023-05-03 ENCOUNTER — Encounter: Payer: Self-pay | Admitting: Family Medicine

## 2023-05-03 ENCOUNTER — Ambulatory Visit (INDEPENDENT_AMBULATORY_CARE_PROVIDER_SITE_OTHER): Payer: Medicare HMO | Admitting: Family Medicine

## 2023-05-03 VITALS — BP 126/68 | HR 60 | Ht 66.0 in | Wt 153.0 lb

## 2023-05-03 DIAGNOSIS — R7301 Impaired fasting glucose: Secondary | ICD-10-CM

## 2023-05-03 DIAGNOSIS — R109 Unspecified abdominal pain: Secondary | ICD-10-CM

## 2023-05-03 DIAGNOSIS — N529 Male erectile dysfunction, unspecified: Secondary | ICD-10-CM

## 2023-05-03 DIAGNOSIS — K2960 Other gastritis without bleeding: Secondary | ICD-10-CM

## 2023-05-03 DIAGNOSIS — I10 Essential (primary) hypertension: Secondary | ICD-10-CM

## 2023-05-03 MED ORDER — SILDENAFIL CITRATE 100 MG PO TABS: 50.0000 mg | ORAL_TABLET | Freq: Every day | ORAL | 5 refills | Status: DC | PRN

## 2023-05-03 NOTE — Assessment & Plan Note (Signed)
Looks great today

## 2023-05-03 NOTE — Assessment & Plan Note (Signed)
Off of prilosec

## 2023-05-03 NOTE — Assessment & Plan Note (Signed)
Well controlled. Continue current regimen. Follow up in  6 mo

## 2023-05-03 NOTE — Assessment & Plan Note (Signed)
Trial of viagra. Discussed potential side effects. Start with half a tab.

## 2023-05-03 NOTE — Progress Notes (Signed)
Established Patient Office Visit  Subjective  Patient ID: Brad Cannon, male    DOB: [birth year]  Age: 78 y.o. MRN: [id number]  Chief Complaint  Patient presents with   Hypertension    HPI  Hypertension- Pt denies chest pain, SOB, dizziness, or heart palpitations.  Taking meds as directed w/o problems.  Denies medication side effects.    He is interested in trying Viagra. Has never taken before. Not on nitrates.  BP well controlled.    Right flank pain that lasted 1.5 days and then resolved. Thought maybe a kidney stone. He had a stone once before in life.    Also c/o of neck pain on side. Noticed it one day while walking at [facility]. Next day other side of neck felt sore.  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  Constitutional:      Appearance: Normal appearance.  HENT:     Head: Normocephalic and atraumatic.  Eyes:     Conjunctiva/sclera: Conjunctivae normal.  Cardiovascular:     Rate and Rhythm: Normal rate and regular rhythm.  Pulmonary:     Effort: Pulmonary effort is normal.     Breath sounds: Normal breath sounds.  Skin:    General: Skin is warm and dry.  Neurological:     Mental Status: He is alert.  Psychiatric:        Mood and Affect: Mood normal.      No results found for any visits on 05/03/23.    The ASCVD Risk score (Arnett DK, et al., 2019) failed to calculate for the following reasons:   The valid total cholesterol range is 130 to 320 mg/dL    Assessment & Plan:   Problem List Items Addressed This Visit       Cardiovascular and Mediastinum   HYPERTENSION, BENIGN ESSENTIAL - Primary   Well controlled. Continue current regimen. Follow up in  83mo       Relevant Medications   sildenafil (VIAGRA) 100 MG tablet   Other Relevant Orders   Lipid panel    Hemoglobin A1c     Digestive   Reflux gastritis   Off of prilosec        Endocrine   IFG (impaired fasting glucose)   Looks great today!!!!!      Relevant Orders   Lipid panel   Hemoglobin A1c     Other   Right flank pain   ED (erectile dysfunction)   Trial of viagra. Discussed potential side effects. Start with half a tab.        Right flank pain-at this point pain is completely resolved unclear etiology it may have also been his back.  If recurs then please let me know but otherwise no for further workup needed.  A1c looks great today.  He does have some tightness in his muscles in his neck can palpate firmness and some knots.  We discussed working on some stretches.  He already has some slightly decreased range of motion especially to the left compared to the right.  If it continues then please let me know.  Return in about 6 months (around 10/31/2023) for Hypertension.    Santina Evans  Linford Arnold, MD

## 2023-05-04 LAB — HEMOGLOBIN A1C
Est. average glucose Bld gHb Est-mCnc: 108 mg/dL
Hgb A1c MFr Bld: 5.4 % (ref 4.8–5.6)

## 2023-05-04 LAB — LIPID PANEL
Chol/HDL Ratio: 2.3 {ratio} (ref 0.0–5.0)
Cholesterol, Total: 123 mg/dL (ref 100–199)
HDL: 54 mg/dL (ref >39)
LDL Chol Calc (NIH): 57 mg/dL (ref 0–99)
Triglycerides: 54 mg/dL (ref 0–149)
VLDL Cholesterol Cal: 12 mg/dL (ref 5–40)

## 2023-05-04 NOTE — Progress Notes (Signed)
 Your lab work is within acceptable range and there are no concerning findings.   ?

## 2023-06-05 ENCOUNTER — Ambulatory Visit: Payer: Medicare HMO

## 2023-06-05 VITALS — Ht 66.0 in | Wt 153.0 lb

## 2023-06-05 DIAGNOSIS — Z0000 Encounter for general adult medical examination without abnormal findings: Secondary | ICD-10-CM

## 2023-06-05 NOTE — Progress Notes (Signed)
 Subjective:   Brad Cannon is a 78 y.o. male who presents for Medicare Annual/Subsequent preventive examination.  Visit Complete: Virtual I connected with  Brad Cannon on 06/05/23 by a audio enabled telemedicine application and verified that I am speaking with the correct person using two identifiers.  Patient Location: Home  Provider Location: Office/Clinic  I discussed the limitations of evaluation and management by telemedicine. The patient expressed understanding and agreed to proceed.  Vital Signs: Because this visit was a virtual/telehealth visit, some criteria may be missing or patient reported. Any vitals not documented were not able to be obtained and vitals that have been documented are patient reported.  Patient Medicare AWV questionnaire was completed by the patient on n/a; I have confirmed that all information answered by patient is correct and no changes since this date.  Cardiac Risk Factors include: advanced age (>37men, >56 women);male gender;hypertension;dyslipidemia;smoking/ tobacco exposure     Objective:    Today's Vitals   06/05/23 0756  Weight: 153 lb (69.4 kg)  Height: 5\' 6"  (1.676 m)   Body mass index is 24.69 kg/m.     06/05/2023    8:07 AM 05/22/2022   11:03 AM 05/16/2021    2:12 PM 12/22/2019    1:44 PM 11/26/2018    2:11 PM 09/08/2014    9:57 AM  Advanced Directives  Does Patient Have a Medical Advance Directive? Yes No No No No No  Type of Estate agent of [place];Living will       Does patient want to make changes to medical advance directive? No - Patient declined       Copy of Healthcare Power of Attorney in Chart? No - copy requested       Would patient like information on creating a medical advance directive?  No - Patient declined No - Patient declined Yes (MAU/Ambulatory/Procedural Areas - Information given) Yes (MAU/Ambulatory/Procedural Areas - Information given) Yes - Educational materials given     Current Medications (verified) Outpatient Encounter Medications as of 06/05/2023  Medication Sig   atorvastatin (LIPITOR) 40 MG tablet TAKE 1 TABLET EVERY DAY   lisinopril (ZESTRIL) 10 MG tablet TAKE 1 TABLET EVERY DAY   sildenafil (VIAGRA) 100 MG tablet Take 0.5-1 tablets (50-100 mg total) by mouth daily as needed for erectile dysfunction.   No facility-administered encounter medications on file as of 06/05/2023.    Allergies (verified) Patient has no known allergies.   History: Past Medical History:  Diagnosis Date   AAA (abdominal aortic aneurysm) (HCC)    Hepatitis A 1998   with elevated LFT's   Hyperlipidemia    Hypertension    IFG (impaired fasting glucose)    Past Surgical History:  Procedure Laterality Date   arthroscopic surgery  1982   left knee   Family History  Problem Relation Age of Onset   Hypertension Mother    Hyperlipidemia Mother    Heart disease Father    Hyperlipidemia Father    Hypertension Father    Hypertension Sister    Hyperlipidemia Sister    Hyperlipidemia Brother    Hypertension Brother    Heart disease Brother    Social History   Socioeconomic History   Marital status: Widowed    Spouse name: Clydie Braun   Number of children: 2   Years of education: 18   Highest education level: Master's degree (e.g., MA, MS, MEng, MEd, MSW, MBA)  Occupational History   Occupation: Engineer, water    Comment: retired  Tobacco Use   Smoking status: Never   Smokeless tobacco: Never  Vaping Use   Vaping status: Never Used  Substance and Sexual Activity   Alcohol use: Yes    Alcohol/week: 2.0 - 3.0 standard drinks of alcohol    Types: 2 - 3 Cans of beer per week   Drug use: No   Sexual activity: Not Currently  Other Topics Concern   Not on file  Social History Narrative   Live alone. His wife passed away in 2021-12-25. He has two children. He enjoys making ship model.   Social Drivers of Corporate investment banker Strain: Low Risk   (06/05/2023)   Overall Financial Resource Strain (CARDIA)    Difficulty of Paying Living Expenses: Not hard at all  Food Insecurity: No Food Insecurity (06/05/2023)   Hunger Vital Sign    Worried About Running Out of Food in the Last Year: Never true    Ran Out of Food in the Last Year: Never true  Transportation Needs: No Transportation Needs (06/05/2023)   PRAPARE - Administrator, Civil Service (Medical): No    Lack of Transportation (Non-Medical): No  Physical Activity: Sufficiently Active (06/05/2023)   Exercise Vital Sign    Days of Exercise per Week: 5 days    Minutes of Exercise per Session: 120 min  Stress: No Stress Concern Present (06/05/2023)   [employer] of Occupational Health - Occupational Stress Questionnaire    Feeling of Stress : Not at all  Social Connections: Moderately Integrated (06/05/2023)   Social Connection and Isolation Panel [NHANES]    Frequency of Communication with Friends and Family: More than three times a week    Frequency of Social Gatherings with Friends and Family: More than three times a week    Attends Religious Services: More than 4 times per year    Active Member of [employer] or Organizations: Yes    Attends Banker Meetings: More than 4 times per year    Marital Status: Widowed    Tobacco Counseling Counseling given: Not Answered   Clinical Intake:  Pre-visit preparation completed: Yes  Pain : No/denies pain     BMI - recorded: 24.69 Nutritional Status: BMI of 19-24  Normal Nutritional Risks: None Diabetes: No  What is the last grade level you completed in school?: 18  Interpreter Needed?: No      Activities of Daily Living    06/05/2023    7:59 AM  In your present state of health, do you have any difficulty performing the following activities:  Hearing? 0  Vision? 0  Difficulty concentrating or making decisions? 0  Walking or climbing stairs? 0  Dressing or bathing? 0  Doing errands, shopping? 0   Preparing Food and eating ? N  Using the Toilet? N  In the past six months, have you accidently leaked urine? N  Do you have problems with loss of bowel control? N  Managing your Medications? N  Managing your Finances? N  Housekeeping or managing your Housekeeping? N    Patient Care Team: Agapito Games, MD as PCP - General (Family Medicine)  Indicate any recent Medical Services you may have received from other than [facility] providers in the past year (date may be approximate).     Assessment:   This is a routine wellness examination for Brad Cannon.  Hearing/Vision screen No results found.   Goals Addressed  This Visit's Progress    DIET - EAT MORE FRUITS AND VEGETABLES       He would like to eat healthy. Like eating more fish.       Depression Screen    06/05/2023    8:06 AM 05/03/2023    8:12 AM 11/23/2022    1:37 PM 10/26/2022    8:06 AM 05/22/2022   11:08 AM 05/16/2021    2:06 PM 04/07/2021   10:00 AM  PHQ 2/9 Scores  PHQ - 2 Score 0 0 0 0 0 0 0    Fall Risk    06/05/2023    8:07 AM 05/03/2023    8:12 AM 11/23/2022    1:37 PM 10/26/2022    8:06 AM 05/22/2022   11:11 AM  Fall Risk   Falls in the past year? 0 0 0 0 0  Number falls in past yr: 0 0 0 0 0  Injury with Fall? 0 0 0 0 0  Risk for fall due to : No Fall Risks No Fall Risks No Fall Risks No Fall Risks No Fall Risks  Follow up Falls evaluation completed Falls evaluation completed Falls evaluation completed Falls evaluation completed Falls evaluation completed    MEDICARE RISK AT HOME: Medicare Risk at Home Any stairs in or around the home?: Yes If so, are there any without handrails?: Yes Home free of loose throw rugs in walkways, pet beds, electrical cords, etc?: Yes Adequate lighting in your home to reduce risk of falls?: Yes Life alert?: No Use of a cane, walker or w/c?: No Grab bars in the bathroom?: Yes Shower chair or bench in shower?: Yes Elevated toilet seat or a handicapped  toilet?: No  TIMED UP AND GO:  Was the test performed?  No    Cognitive Function:        06/05/2023    8:09 AM 05/22/2022   11:13 AM 05/16/2021    2:11 PM 12/22/2019    1:46 PM 11/26/2018    2:18 PM  6CIT Screen  What Year?  0 points 0 points  0 points  What month?  0 points 0 points  0 points  What time? 0 points 0 points 0 points 0 points 0 points  Count back from 20 0 points 0 points 0 points 0 points 0 points  Months in reverse 0 points 0 points 0 points 0 points 0 points  Repeat phrase 0 points 0 points 0 points 0 points 0 points  Total Score  0 points 0 points  0 points    Immunizations Immunization History  Administered Date(s) Administered   Fluad Quad(high Dose 65+) 12/17/2019, 11/30/2020   Fluad Trivalent(High Dose 65+) 11/23/2022   Influenza Split 01/01/2012   Influenza Whole 02/21/2006   Influenza, High Dose Seasonal PF 01/23/2017, 01/22/2018, 01/15/2019   Influenza,inj,Quad PF,6+ Mos 12/25/2013, 01/22/2018, 12/21/2021   Influenza-Unspecified 06-May-1945, 01/15/2013, 01/12/2015, 02/03/2016   Moderna Sars-Covid-2 Vaccination 06/02/2019, 06/30/2019, 01/30/2020   Pneumococcal Conjugate-13 02/18/2014   Pneumococcal Polysaccharide-23 11/28/2011   Td 02/21/2006   Tdap 04/18/2016   Zoster Recombinant(Shingrix) 10/11/2020, 01/17/2021   Zoster, Live 10/09/2008    TDAP status: Up to date  Flu Vaccine status: Up to date  Pneumococcal vaccine status: Due, Education has been provided regarding the importance of this vaccine. Advised may receive this vaccine at local pharmacy or Health Dept. Aware to provide a copy of the vaccination record if obtained from local pharmacy or Health Dept. Verbalized acceptance and understanding.  Covid-19 vaccine status:  Completed vaccines  Qualifies for Shingles Vaccine? Yes   Zostavax completed Yes   Shingrix Completed?: Yes  Screening Tests Health Maintenance  Topic Date Due   COVID-19 Vaccine (4 - 2024-25 season) 12/03/2022    Medicare Annual Wellness (AWV)  06/04/2024   DTaP/Tdap/Td (3 - Td or Tdap) 04/18/2026   Pneumonia Vaccine 8+ Years old  Completed   INFLUENZA VACCINE  Completed   Hepatitis C Screening  Completed   Zoster Vaccines- Shingrix  Completed   HPV VACCINES  Aged Out   Colonoscopy  Discontinued    Health Maintenance  Health Maintenance Due  Topic Date Due   COVID-19 Vaccine (4 - 2024-25 season) 12/03/2022    Colorectal cancer screening: No longer required.   Lung Cancer Screening: (Low Dose CT Chest recommended if Age 72-80 years, 20 pack-year currently smoking OR have quit w/in 15years.) does not qualify.   Lung Cancer Screening Referral: n/a  Additional Screening:  Hepatitis C Screening: does qualify; Completed 05/26/2013  Vision Screening: Recommended annual ophthalmology exams for early detection of glaucoma and other disorders of the eye. Is the patient up to date with their annual eye exam?  Yes  Who is the provider or what is the name of the office in which the patient attends annual eye exams? Dr Clearance Coots If pt is not established with a provider, would they like to be referred to a provider to establish care?  N/a .   Dental Screening: Recommended annual dental exams for proper oral hygiene    Community Resource Referral / Chronic Care Management: CRR required this visit?  No   CCM required this visit?  No     Plan:     I have personally reviewed and noted the following in the patient's chart:   Medical and social history Use of alcohol, tobacco or illicit drugs  Current medications and supplements including opioid prescriptions. Patient is not currently taking opioid prescriptions. Functional ability and status Nutritional status Physical activity Advanced directives List of other physicians Hospitalizations, surgeries, and ER visits in previous 12 months Vitals Screenings to include cognitive, depression, and falls Referrals and appointments  In addition,  I have reviewed and discussed with patient certain preventive protocols, quality metrics, and best practice recommendations. A written personalized care plan for preventive services as well as general preventive health recommendations were provided to patient.     Esmond Harps, CMA   06/05/2023   After Visit Summary: (Mail) Due to this being a telephonic visit, the after visit summary with patients personalized plan was offered to patient via mail   Nurse Notes:   NELLIE CHEVALIER is a 78 y.o. male patient of Metheney, Barbarann Ehlers, MD who had a Medicare Annual Wellness Visit today via telephone. Rimas is Retired and lives alone. He has 2 children. He reports that he is socially active and does interact with friends/family regularly. he is moderately physically active and enjoys making ship models.   Recommend Prevnar 20 on next visit to the clinic.

## 2023-06-05 NOTE — Patient Instructions (Signed)
  Brad Cannon , Thank you for taking time to come for your Medicare Wellness Visit. I appreciate your ongoing commitment to your health goals. Please review the following plan we discussed and let me know if I can assist you in the future.   These are the goals we discussed:  Goals       DIET - EAT MORE FRUITS AND VEGETABLES      He would like to eat healthy. Like eating more fish.       Patient Stated      Patient stated wants to try and drink less alcohol for the rest of the year      Patient Stated (pt-stated)      I would like to get more involved in model ship building.       Patient Stated (pt-stated)      Loose 5 lbs.      Patient Stated (pt-stated)      Patient stated that he would like to continue his healthy lifestyle.        This is a list of the screening recommended for you and due dates:  Health Maintenance  Topic Date Due   COVID-19 Vaccine (4 - 2024-25 season) 12/03/2022   Medicare Annual Wellness Visit  06/04/2024   DTaP/Tdap/Td vaccine (3 - Td or Tdap) 04/18/2026   Pneumonia Vaccine  Completed   Flu Shot  Completed   Hepatitis C Screening  Completed   Zoster (Shingles) Vaccine  Completed   HPV Vaccine  Aged Out   Colon Cancer Screening  Discontinued

## 2023-08-16 DIAGNOSIS — Z0100 Encounter for examination of eyes and vision without abnormal findings: Secondary | ICD-10-CM | POA: Diagnosis not present

## 2023-08-16 DIAGNOSIS — H5213 Myopia, bilateral: Secondary | ICD-10-CM | POA: Diagnosis not present

## 2023-09-05 ENCOUNTER — Other Ambulatory Visit: Payer: Self-pay | Admitting: Family Medicine

## 2023-09-05 DIAGNOSIS — I10 Essential (primary) hypertension: Secondary | ICD-10-CM

## 2023-10-04 ENCOUNTER — Ambulatory Visit: Payer: Self-pay

## 2023-10-04 NOTE — Telephone Encounter (Signed)
 FYI Only or Action Required?: FYI only for provider.  Patient was last seen in primary care on 05/03/2023 by Alvan Dorothyann BIRCH, MD. Called Nurse Triage reporting Toe Pain. Symptoms began a week ago. Interventions attempted: Rest, hydration, or home remedies. Symptoms are: gradually worsening.  Triage Disposition: See PCP When Office is Open (Within 3 Days)  Patient/caregiver understands and will follow disposition?: Yes, but will wait  Copied from CRM [id number]. Topic: Clinical - Red Word Triage >> Oct 04, 2023  1:34 PM Miquel SAILOR wrote: Red Word that prompted transfer to Nurse Triage: Patient  RT Toe Nail blue /Red color Also very pain 2-3 weeks but 1-2 days getting worse Reason for Disposition  [1] MODERATE pain (e.g., interferes with normal activities, limping) AND [2] present > 3 days  Answer Assessment - Initial Assessment Questions 1. ONSET: When did the pain start?      One week ago, worsening past 1-2 days 2. LOCATION: Where is the pain located?   (e.g., around nail, entire toe, at foot joint)      Right foot great toe  3. PAIN: How bad is the pain?    (Scale 1-10; or mild, moderate, severe)   -  MILD (1-3): doesn't interfere with normal activities    -  MODERATE (4-7): interferes with normal activities (e.g., work or school) or awakens from sleep, limping    -  SEVERE (8-10): excruciating pain, unable to do any normal activities, unable to walk     Moderate and worsening 4. APPEARANCE: What does the toe look like? (e.g., redness, swelling, bruising, pallor)     Red with bluish discoloration under and surrounding nail bed Temperature is normal. Normal sensation.  5. CAUSE: What do you think is causing the toe pain?     unsure 6. OTHER SYMPTOMS: Do you have any other symptoms? (e.g., leg pain, rash, fever, numbness)     denies  Additional notes:  History of gout but does not feel the same. Patient prefers to wait until acute visit on 10/10/23 vs urgent care but will  proceed to [facility] if symptoms worsen.  Protocols used: Toe Pain-A-AH

## 2023-10-04 NOTE — Telephone Encounter (Signed)
 FYI

## 2023-10-08 ENCOUNTER — Encounter: Payer: Self-pay | Admitting: Family Medicine

## 2023-10-08 ENCOUNTER — Ambulatory Visit (INDEPENDENT_AMBULATORY_CARE_PROVIDER_SITE_OTHER): Admitting: Family Medicine

## 2023-10-08 VITALS — BP 135/85 | HR 91 | Ht 66.0 in | Wt 150.0 lb

## 2023-10-08 DIAGNOSIS — S99921A Unspecified injury of right foot, initial encounter: Secondary | ICD-10-CM | POA: Diagnosis not present

## 2023-10-08 MED ORDER — DOXYCYCLINE HYCLATE 100 MG PO TABS: 100.0000 mg | ORAL_TABLET | Freq: Two times a day (BID) | ORAL | 0 refills | Status: DC

## 2023-10-08 NOTE — Progress Notes (Signed)
   Established Patient Office Visit  Subjective  Patient ID: Brad Cannon, male    DOB: 1945-11-27  Age: 78 y.o. MRN: [id number]  Chief Complaint  Patient presents with   Toe Injury    HPI Normally walks for 2 hours around [place] and usually wears his hiking boots but wore different shoes and socks and felt the sock shifting and pressing on his toe.    A few days later noticed the nail was discolored.     ROS    Objective:     BP 135/85   Pulse 91   Ht 5' 6 (1.676 m)   Wt 150 lb 0.6 oz (68.1 kg)   SpO2 99%   BMI 24.22 kg/m    Physical Exam Vitals reviewed.  Constitutional:      Appearance: Normal appearance.  HENT:     Head: Normocephalic.  Pulmonary:     Effort: Pulmonary effort is normal.  Skin:    Comments: Right great toenail is red under medial distal nail and bruising under est of nail. Mildly tender at the tip.  Neurological:     Mental Status: He is alert and oriented to person, place, and time.  Psychiatric:        Mood and Affect: Mood normal.        Behavior: Behavior normal.      No results found for any visits on 10/08/23.    The ASCVD Risk score (Arnett DK, et al., 2019) failed to calculate for the following reasons:   The valid total cholesterol range is 130 to 320 mg/dL    Assessment & Plan:   Problem List Items Addressed This Visit   None Visit Diagnoses       Injury of toe on right foot, initial encounter    -  Primary      Discussed avoiding repeat trauma.  He has been wearing his usual shoes again and able to walk without pain. Gave reassurance. He will likely lose the nail. Also Gave him an ABX in case starts to look more red, tender or draining.  Given ABX since leaving this week for a 21 days cruise and will be out of town. Given parameter for when to take meds.   No follow-ups on file.    Dorothyann Byars, MD

## 2023-10-08 NOTE — Progress Notes (Signed)
 Pt reports that he was walking the trail at [facility] and was wearing shoes and socks that he normally doesn't wear whenever he walks this trail.   He stated that he notice that while walking that the sock that he was wearing felt like it was rubbing against his big R toe. He stated that it felt like it was lifting and pushing.   On Tuesday and Thursday he walked the trail again but that time he wore the socks and hiking boots that he normally wears.  Wednesday he noticed that his R Great toenail was discolored. He didn't try any Epsom salt soaks.  He cut his toenail yesterday stated that its not painful.

## 2023-11-07 ENCOUNTER — Other Ambulatory Visit: Payer: Self-pay | Admitting: Family Medicine

## 2023-11-22 ENCOUNTER — Encounter: Payer: Self-pay | Admitting: Family Medicine

## 2023-11-22 ENCOUNTER — Ambulatory Visit (INDEPENDENT_AMBULATORY_CARE_PROVIDER_SITE_OTHER): Payer: Medicare HMO | Admitting: Family Medicine

## 2023-11-22 VITALS — BP 129/80 | HR 70 | Ht 66.0 in | Wt 153.0 lb

## 2023-11-22 DIAGNOSIS — M79652 Pain in left thigh: Secondary | ICD-10-CM

## 2023-11-22 DIAGNOSIS — R718 Other abnormality of red blood cells: Secondary | ICD-10-CM

## 2023-11-22 DIAGNOSIS — I10 Essential (primary) hypertension: Secondary | ICD-10-CM

## 2023-11-22 DIAGNOSIS — D692 Other nonthrombocytopenic purpura: Secondary | ICD-10-CM | POA: Diagnosis not present

## 2023-11-22 DIAGNOSIS — M5416 Radiculopathy, lumbar region: Secondary | ICD-10-CM | POA: Diagnosis not present

## 2023-11-22 DIAGNOSIS — I77811 Abdominal aortic ectasia: Secondary | ICD-10-CM | POA: Diagnosis not present

## 2023-11-22 DIAGNOSIS — R7401 Elevation of levels of liver transaminase levels: Secondary | ICD-10-CM

## 2023-11-22 DIAGNOSIS — R7301 Impaired fasting glucose: Secondary | ICD-10-CM | POA: Diagnosis not present

## 2023-11-22 LAB — POCT GLYCOSYLATED HEMOGLOBIN (HGB A1C): Hemoglobin A1C: 5.2 % (ref 4.0–5.6)

## 2023-11-22 MED ORDER — NAPROXEN 500 MG PO TABS: 500.0000 mg | ORAL_TABLET | Freq: Two times a day (BID) | ORAL | 1 refills | Status: DC

## 2023-11-22 NOTE — Assessment & Plan Note (Signed)
 Looks great today

## 2023-11-22 NOTE — Assessment & Plan Note (Signed)
 Well controlled. Continue current regimen. Follow up in  6 mo

## 2023-11-22 NOTE — Progress Notes (Signed)
 Established Patient Office Visit  Subjective  Patient ID: Brad Cannon, male    DOB: [birth year]  Age: 78 y.o. MRN: [id number]  Chief Complaint  Patient presents with   Hypertension   ifg    HPI  Discussed the use of AI scribe software for clinical note transcription with the patient, who gave verbal consent to proceed.  History of Present Illness Brad Cannon is a 78 year old male who presents with left-sided pain exacerbated by sitting.  Left-sided pain with radiation to thigh - Onset around August 8th or 9th, 2025, following a 25-day cruise - Pain localized to the left side, radiating down to the left thigh - Described as a pulling sensation - Exacerbated by sitting andactivities such as driving and using the bathroom difficult - Improved since onset, but still present - Walking and swimming alleviate the pain - Sitting worsens the pain - Crossing legs provides some relief - No associated leg weakness  Precipitating and contributing factors - Increased physical activity during cruise, including extensive walking and walking tours - Carried a heavy canvas bag through an airport, felt a pull on the left side while dragging the bag - Inquires about possible contribution of high sugar intake during cruise, particularly from lemonade and apple juice  Medication use and response - No recent use of anti-inflammatory medications - Previous use of meloxicam without benefit - Familiar with naproxen  from past use  Associated symptoms and review of systems - No chest pain or unusual symptoms - No current issues with toenail; previous toenail problem did not require antibiotics and is not bothersome  Noticed bruising on forearms after yardwork.   Injured toenail still attached. He never  needed to take the doxycycline .     ROS    Objective:     BP 129/80   Pulse 70   Ht 5' 6 (1.676 m)   Wt 153 lb (69.4 kg)   SpO2 97%   BMI 24.69 kg/m    Physical  Exam Vitals and nursing note reviewed.  Constitutional:      Appearance: Normal appearance.  HENT:     Head: Normocephalic and atraumatic.  Eyes:     Conjunctiva/sclera: Conjunctivae normal.  Cardiovascular:     Rate and Rhythm: Normal rate and regular rhythm.  Pulmonary:     Effort: Pulmonary effort is normal.     Breath sounds: Normal breath sounds.  Skin:    General: Skin is warm and dry.     Comments: Right great toenail with bruising under nail. Still attached. No surrounding erythema   Neurological:     Mental Status: He is alert.  Psychiatric:        Mood and Affect: Mood normal.      Results for orders placed or performed in visit on 11/22/23  POCT HgB A1C  Result Value Ref Range   Hemoglobin A1C 5.2 4.0 - 5.6 %   HbA1c POC (<> result, manual entry)     HbA1c, POC (prediabetic range)     HbA1c, POC (controlled diabetic range)        The ASCVD Risk score (Arnett DK, et al., 2019) failed to calculate for the following reasons:   The valid total cholesterol range is 130 to 320 mg/dL    Assessment & Plan:   Problem List Items Addressed This Visit       Cardiovascular and Mediastinum   Purpura senilis (HCC)   HYPERTENSION, BENIGN ESSENTIAL - Primary   Well controlled.  Continue current regimen. Follow up in  50mo       Relevant Orders   CMP14+EGFR   Abdominal aortic ectasia (HCC)   Measures 2.9 cm on ultrasound performed July 2020. Recommendation is for repeat in 5 years which would be July 2025.       Relevant Orders   US  AORTA     Endocrine   IFG (impaired fasting glucose)   Looks great today.        Relevant Orders   CMP14+EGFR   POCT HgB A1C (Completed)     Other   Elevated AST (SGOT)   Relevant Orders   CMP14+EGFR   Other Visit Diagnoses       Elevated MCV       Relevant Orders   CBC with Differential/Platelet   B12 and Folate Panel     Left thigh pain       Relevant Medications   naproxen  (NAPROSYN ) 500 MG tablet     Left lumbar  radiculopathy       Relevant Medications   naproxen  (NAPROSYN ) 500 MG tablet      Assessment and Plan Assessment & Plan Left lumbar radiculopathy Likely due to increased physical activity and strain. Symptoms suggest nerve involvement from the lumbar spine. High sugar intake may contribute to general inflammation. - Prescribe prescription strength naproxen  for 10 days. - Advise reducing sugar intake. - Consider x-ray if no improvement.  Abdominal aortic ectasia Slight widening, not an aneurysm. Blood pressure control is crucial. - Order abdominal ultrasound. - Monitor blood pressure regularly.  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 Not indicative of underlying pathology. - Provide reassurance about benign nature.   Elevated MCV on last labs - check B12 and folate.    Return in about 6 months (around 05/24/2024) for Hypertension.    Dorothyann Byars, MD

## 2023-11-22 NOTE — Assessment & Plan Note (Signed)
Measures 2.9 cm on ultrasound performed July 2020.  Recommendation is for repeat in 5 years which would be July 2025. 

## 2023-11-23 LAB — CBC WITH DIFFERENTIAL/PLATELET
Basophils Absolute: 0 x10E3/uL (ref 0.0–0.2)
Basos: 1 %
EOS (ABSOLUTE): 0.1 x10E3/uL (ref 0.0–0.4)
Eos: 1 %
Hematocrit: 45.1 % (ref 37.5–51.0)
Hemoglobin: 15.1 g/dL (ref 13.0–17.7)
Immature Grans (Abs): 0 x10E3/uL (ref 0.0–0.1)
Immature Granulocytes: 0 %
Lymphocytes Absolute: 2.3 x10E3/uL (ref 0.7–3.1)
Lymphs: 26 %
MCH: 33.9 pg — ABNORMAL HIGH (ref 26.6–33.0)
MCHC: 33.5 g/dL (ref 31.5–35.7)
MCV: 101 fL — ABNORMAL HIGH (ref 79–97)
Monocytes Absolute: 0.8 x10E3/uL (ref 0.1–0.9)
Monocytes: 9 %
Neutrophils Absolute: 5.6 x10E3/uL (ref 1.4–7.0)
Neutrophils: 63 %
Platelets: 242 x10E3/uL (ref 150–450)
RBC: 4.46 x10E6/uL (ref 4.14–5.80)
RDW: 11.9 % (ref 11.6–15.4)
WBC: 8.8 x10E3/uL (ref 3.4–10.8)

## 2023-11-23 LAB — CMP14+EGFR
ALT: 16 IU/L (ref 0–44)
AST: 56 IU/L — ABNORMAL HIGH (ref 0–40)
Albumin: 4.3 g/dL (ref 3.8–4.8)
Alkaline Phosphatase: 96 IU/L (ref 44–121)
BUN/Creatinine Ratio: 19 (ref 10–24)
BUN: 20 mg/dL (ref 8–27)
Bilirubin Total: 0.7 mg/dL (ref 0.0–1.2)
CO2: 20 mmol/L (ref 20–29)
Calcium: 9.6 mg/dL (ref 8.6–10.2)
Chloride: 102 mmol/L (ref 96–106)
Creatinine, Ser: 1.03 mg/dL (ref 0.76–1.27)
Globulin, Total: 2.8 g/dL (ref 1.5–4.5)
Glucose: 95 mg/dL (ref 70–99)
Potassium: 4.3 mmol/L (ref 3.5–5.2)
Sodium: 138 mmol/L (ref 134–144)
Total Protein: 7.1 g/dL (ref 6.0–8.5)
eGFR: 74 mL/min/1.73 (ref >59)

## 2023-11-23 LAB — B12 AND FOLATE PANEL
Folate: 9.2 ng/mL (ref >3.0)
Vitamin B-12: 489 pg/mL (ref 232–1245)

## 2023-11-24 ENCOUNTER — Ambulatory Visit: Payer: Self-pay | Admitting: Family Medicine

## 2023-11-24 DIAGNOSIS — R7401 Elevation of levels of liver transaminase levels: Secondary | ICD-10-CM

## 2023-11-24 NOTE — Progress Notes (Signed)
 Call pt: AST liver enzyme still slighlt elevated about the same. Blood count, B12 and folate are normal. I'm going to order an Up to date US  of the liver since has been 7 years since last one.

## 2023-11-27 ENCOUNTER — Ambulatory Visit: Payer: Medicare HMO | Admitting: Family Medicine

## 2023-11-30 ENCOUNTER — Encounter: Payer: Self-pay | Admitting: Internal Medicine

## 2023-11-30 ENCOUNTER — Ambulatory Visit (INDEPENDENT_AMBULATORY_CARE_PROVIDER_SITE_OTHER): Admitting: Internal Medicine

## 2023-11-30 ENCOUNTER — Ambulatory Visit: Payer: Self-pay

## 2023-11-30 VITALS — BP 118/66 | HR 86 | Temp 98.0°F | Resp 16 | Ht 66.0 in | Wt 154.5 lb

## 2023-11-30 DIAGNOSIS — M5416 Radiculopathy, lumbar region: Secondary | ICD-10-CM | POA: Diagnosis not present

## 2023-11-30 MED ORDER — PREDNISONE 10 MG PO TABS
ORAL_TABLET | ORAL | 0 refills | Status: DC
Start: 2023-11-30 — End: 2023-12-18

## 2023-11-30 MED ORDER — CYCLOBENZAPRINE HCL 10 MG PO TABS: 10.0000 mg | ORAL_TABLET | Freq: Every evening | ORAL | 0 refills | Status: DC | PRN

## 2023-11-30 NOTE — Progress Notes (Signed)
 78 year old male, PMH  Subjective:    Patient ID: Brad Cannon, male    DOB: 01/16/1946, 78 y.o.   MRN: [id number]  DOS:  11/30/2023 Type of visit - description: Acute  Developed left low back pain about 4 weeks ago, worse with standing or sitting. Subsequently as the pain subsided he developed a mix of pain/numbness at the frontal area of the left thigh That discomfort is worse with certain positions but is not persistent.  No fever or chills No rash anywhere in the back or leg No injury No bladder or bowel incontinence.  Review of Systems See above   Past Medical History:  Diagnosis Date   AAA (abdominal aortic aneurysm) (HCC)    Hepatitis A 1998   with elevated LFT's   Hyperlipidemia    Hypertension    IFG (impaired fasting glucose)     Past Surgical History:  Procedure Laterality Date   arthroscopic surgery  1982   left knee    Current Outpatient Medications  Medication Instructions   atorvastatin  (LIPITOR) 40 mg, Oral, Daily   cyclobenzaprine  (FLEXERIL ) 10 mg, Oral, At bedtime PRN   lisinopril  (ZESTRIL ) 10 mg, Oral, Daily   naproxen  (NAPROSYN ) 500 mg, Oral, 2 times daily with meals   predniSONE  (DELTASONE ) 10 MG tablet 4 tablets x 2 days, 3 tabs x 2 days, 2 tabs x 2 days, 1 tab x 2 days   sildenafil  (VIAGRA ) 50-100 mg, Oral, Daily PRN       Objective:   Physical Exam BP 118/66   Pulse 86   Temp 98 F (36.7 C) (Oral)   Resp 16   Ht 5' 6 (1.676 m)   Wt 154 lb 8 oz (70.1 kg)   SpO2 96%   BMI 24.94 kg/m  General:   Well developed, NAD, BMI noted. HEENT:  Normocephalic . Face symmetric, atraumatic MSK: No TTP at the lumbosacral spine or SI joints Lower extremities: no pretibial edema bilaterally  Skin: Not pale. Not jaundice Neurologic:  alert & oriented X3.  Speech normal, gait appropriate for age and unassisted. Motor and DTR symmetric, straight leg test negative. Psych--  Cognition and judgment appear intact.  Cooperative with normal  attention span and concentration.  Behavior appropriate. No anxious or depressed appearing.      Assessment    PMH includes high cholesterol, HTN, presents with:  Radiculopathy: Symptoms suggest L4 radiculopathy, neurological exam is normal, I will recommend the following: Stop NSAIDs, has taken them consistently for at least 1 week, risk of PUD Round of prednisone  Tylenol Cyclobenzaprine , at night only, watch for somnolence. If not better or if he reports he will contact his PCP or me, may need further evaluation or a referral to sports medicine.

## 2023-11-30 NOTE — Patient Instructions (Signed)
 Instructions for today  Stop naproxen , do not take ibuprofen  Take prednisone , follow-up instructions in the bottle.  You can also take Tylenol  500 mg OTC 2 tabs a day every 8 hours as needed for pain.  Cyclobenzaprine  is a muscle relaxant, take 1 at bedtime.  May cause drowsiness.  Be careful.  If you are not gradually better let us  know, you could be sent to the sports medicine doctor.  If your symptoms get severe let us  know

## 2023-11-30 NOTE — Telephone Encounter (Signed)
 FYI Only or Action Required?: FYI only for provider.  Patient was last seen in primary care on 11/22/2023 by Alvan Dorothyann BIRCH, MD.  Called Nurse Triage reporting No chief complaint on file..  Symptoms began about a month ago.  Interventions attempted: Prescription medications: Naproxen  Rx .  Symptoms are: unchanged.  Triage Disposition: See PCP When Office is Open (Within 3 Days)  Patient/caregiver understands and will follow disposition?: Yes   Copied from CRM #[id number]. Topic: Clinical - Red Word Triage >> Nov 30, 2023  9:24 AM Zane F wrote: [facility] that prompted transfer to Nurse Triage:   Back pain  Located: Lower back and going down left leg  Pain Level: 7/ 10  Patient is taking the naproxen  (NAPROSYN ) 500 MG tablet and was informed the pain show improve after 10 days but the patient is not receiving any relief Reason for Disposition  [1] Pain radiates into the thigh or further down the leg AND [2] one leg  Answer Assessment - Initial Assessment Questions 1. ONSET: When did the pain begin? (e.g., minutes, hours, days)     One Month  2. LOCATION: Where does it hurt? (upper, mid or lower back)     Lower Back, Left Side  3. SEVERITY: How bad is the pain?  (e.g., Scale 1-10; mild, moderate, or severe)     7  4. PATTERN: Is the pain constant? (e.g., yes, no; constant, intermittent)      Intermittent, Worsened by sitting and lying down at night  5. RADIATION: Does the pain shoot into your legs or somewhere else?     Radiating Down the Left leg  6. CAUSE:  What do you think is causing the back pain?      Unsure  7. BACK OVERUSE:  Any recent lifting of heavy objects, strenuous work or exercise?     25 Day Cruise, Excessive Walking, Carried around an overloaded backpack around the airport as well  8. MEDICINES: What have you taken so far for the pain? (e.g., nothing, acetaminophen, NSAIDS)     Naproxen   9. NEUROLOGIC SYMPTOMS: Do you have  any weakness, numbness, or problems with bowel/bladder control?     Tingling  10. OTHER SYMPTOMS: Do you have any other symptoms? (e.g., fever, abdomen pain, burning with urination, blood in urine)       Denies any other symptoms  Protocols used: Back Pain-A-AH

## 2023-11-30 NOTE — Telephone Encounter (Signed)
 Patient has appt schld today at [facility] at 2:20pm

## 2023-12-05 ENCOUNTER — Ambulatory Visit (INDEPENDENT_AMBULATORY_CARE_PROVIDER_SITE_OTHER)

## 2023-12-05 DIAGNOSIS — R7401 Elevation of levels of liver transaminase levels: Secondary | ICD-10-CM

## 2023-12-05 DIAGNOSIS — K769 Liver disease, unspecified: Secondary | ICD-10-CM | POA: Diagnosis not present

## 2023-12-05 DIAGNOSIS — K709 Alcoholic liver disease, unspecified: Secondary | ICD-10-CM | POA: Diagnosis not present

## 2023-12-05 DIAGNOSIS — I77811 Abdominal aortic ectasia: Secondary | ICD-10-CM

## 2023-12-05 DIAGNOSIS — N281 Cyst of kidney, acquired: Secondary | ICD-10-CM | POA: Diagnosis not present

## 2023-12-05 DIAGNOSIS — I77819 Aortic ectasia, unspecified site: Secondary | ICD-10-CM | POA: Diagnosis not present

## 2023-12-05 DIAGNOSIS — K759 Inflammatory liver disease, unspecified: Secondary | ICD-10-CM | POA: Diagnosis not present

## 2023-12-10 ENCOUNTER — Ambulatory Visit: Payer: Self-pay | Admitting: Family Medicine

## 2023-12-10 NOTE — Progress Notes (Signed)
 Call patient: Ultrasound of the liver is normal no concerning findings no sign of cirrhosis or fatty liver.  You do have some small simple cysts on the kidneys but nothing that requires additional workup they look benign.  They did see a little bit of plaque in the abdominal aorta.  For now we will continue to keep an eye on the AST liver enzyme.  Make sure keeping any alcohol intake to a minimum.

## 2023-12-14 NOTE — Progress Notes (Signed)
 Palpation: Ultrasound of the aorta looks good.  No significant changes no development towards aneurysm which is fantastic.

## 2023-12-14 NOTE — Progress Notes (Signed)
 Sorry the word palpitation should say call patient

## 2023-12-17 ENCOUNTER — Telehealth: Payer: Self-pay

## 2023-12-17 NOTE — Telephone Encounter (Signed)
 Copied from CRM #[id number]. Topic: Clinical - Lab/Test Results >> Dec 17, 2023 11:47 AM Susanna ORN wrote: Reason for CRM: Patient returning Christal's call in regards to imaging results. Please give him a call back. CB #: [phone]

## 2023-12-17 NOTE — Telephone Encounter (Signed)
 Patient informed of results.

## 2023-12-18 ENCOUNTER — Ambulatory Visit (INDEPENDENT_AMBULATORY_CARE_PROVIDER_SITE_OTHER)

## 2023-12-18 ENCOUNTER — Encounter: Payer: Self-pay | Admitting: Family Medicine

## 2023-12-18 ENCOUNTER — Ambulatory Visit (INDEPENDENT_AMBULATORY_CARE_PROVIDER_SITE_OTHER): Admitting: Family Medicine

## 2023-12-18 VITALS — BP 123/84 | HR 68 | Ht 65.0 in | Wt 152.0 lb

## 2023-12-18 DIAGNOSIS — M79652 Pain in left thigh: Secondary | ICD-10-CM | POA: Diagnosis not present

## 2023-12-18 DIAGNOSIS — M79605 Pain in left leg: Secondary | ICD-10-CM

## 2023-12-18 DIAGNOSIS — Z23 Encounter for immunization: Secondary | ICD-10-CM

## 2023-12-18 DIAGNOSIS — M51369 Other intervertebral disc degeneration, lumbar region without mention of lumbar back pain or lower extremity pain: Secondary | ICD-10-CM | POA: Diagnosis not present

## 2023-12-18 NOTE — Progress Notes (Signed)
 Established Patient Office Visit  Subjective  Patient ID: Brad Cannon, male    DOB: February 27, 1946  Age: 78 y.o. MRN: [id number]  Chief Complaint  Patient presents with   Leg Pain    HPI Discussed the use of AI scribe software for clinical note transcription with the patient, who gave verbal consent to proceed.  History of Present Illness Brad Cannon is a 78 year old male who presents with persistent left anterior thigh pain.  Left anterior thigh pain - Persistent pain in the left anterior thigh for approximately two weeks - Pain radiates from the anterior thigh to the knee, with occasional discomfort in the lateral hip area - Pain is most pronounced in the evening, especially when sitting after dinner, and can awaken him at night - Minimal pain during the day while active - No associated low back pain - No falls or episodes of leg giving out  Functional status and activity tolerance - Remains active and participates in exercise classes at the [facility], including weightlifting and squats, despite pain - Able to perform activities such as tennis post-knee surgery  Response to treatment - Naprosyn  provided no pain relief - Prednisone  provided significant relief, reducing pain to 4/10, but pain has since returned, though less intense than initially - Cyclobenzaprine  was also prescribed  Musculoskeletal injury history - Possible strain from carrying a heavy bag after a cruise may have contributed to symptoms - History of left knee surgery in the mid-1980s for a torn ligament, repaired with an open procedure - Concern for possible degeneration related to prior knee injury     ROS    Objective:     BP 123/84   Pulse 68   Ht 5' 5 (1.651 m)   Wt 152 lb 0.6 oz (69 kg)   SpO2 96%   BMI 25.30 kg/m    Physical Exam Vitals reviewed.  Constitutional:      Appearance: Normal appearance.  HENT:     Head: Normocephalic.  Pulmonary:     Effort: Pulmonary effort  is normal.  Musculoskeletal:     Comments: Nontender over the lumbar spine or SI joints.  Hip, knee, ankle strength is 5 out of 5 bilaterally.  No pain with internal/external rotation of the left hip nontender over the greater trochanter on the left.  Hip abduction strength is 5 out of 5.  Neurological:     Mental Status: He is alert and oriented to person, place, and time.  Psychiatric:        Mood and Affect: Mood normal.        Behavior: Behavior normal.      No results found for any visits on 12/18/23.    The ASCVD Risk score (Arnett DK, et al., 2019) failed to calculate for the following reasons:   The valid total cholesterol range is 130 to 320 mg/dL    Assessment & Plan:   Problem List Items Addressed This Visit   None Visit Diagnoses       Acute leg pain, left    -  Primary   Relevant Orders   DG Lumbar Spine Complete (Completed)   DG Knee Complete 4 Views Left (Completed)     Encounter for immunization       Relevant Orders   Flu vaccine HIGH DOSE PF(Fluzone Trivalent) (Completed)      Assessment and Plan Assessment & Plan Left anterior thigh pain Chronic left anterior thigh pain, likely inflammatory. Differential includes L2-L3 nerve impingement,  muscle strain, or referred knee pain. - Order lumbar spine x-ray for possible L2-L3 nerve impingement. - Order left knee x-ray for degenerative changes or referred pain w/ pior hx of surgery    - Administer flu vaccine.    No follow-ups on file.    Dorothyann Byars, MD

## 2023-12-19 ENCOUNTER — Ambulatory Visit: Payer: Self-pay | Admitting: Family Medicine

## 2023-12-19 ENCOUNTER — Encounter: Payer: Self-pay | Admitting: Family Medicine

## 2023-12-19 NOTE — Progress Notes (Signed)
 Hi Ron, x-ray of the left knee actually looks pretty good there is no fluid no significant degenerative arthritis going on.  I think next step would be to either consider some advanced imaging or even consider getting you in with one of our sports med/Ortho docs to make sure that we are going down the right path and we are ordering the right imaging.  We do have a new sports med doc or [place] location if that something you would be okay with his name is Dr. Redell Robes.  Let me know what you think.

## 2023-12-19 NOTE — Progress Notes (Signed)
 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  It is more pronounced at L4-5 and L5-S1 this is a little below the nerve area that would cause the pain in the anterior thigh.  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

## 2023-12-20 ENCOUNTER — Telehealth: Payer: Self-pay

## 2023-12-20 ENCOUNTER — Other Ambulatory Visit: Payer: Self-pay | Admitting: Family Medicine

## 2023-12-20 ENCOUNTER — Encounter: Payer: Self-pay | Admitting: Family Medicine

## 2023-12-20 DIAGNOSIS — M79605 Pain in left leg: Secondary | ICD-10-CM

## 2023-12-20 NOTE — Telephone Encounter (Signed)
 Patient states he has spoken with Annabella Rigg regarding these results.

## 2023-12-20 NOTE — Telephone Encounter (Signed)
 Copied from CRM [id number]. Topic: Clinical - Lab/Test Results >> Dec 20, 2023  2:11 PM Cherylann RAMAN wrote: Reason for CRM: Patient calling regarding xray results. Reached out to CAL and tx'd call successfully to CAL >> Dec 20, 2023  2:23 PM Suzette B wrote: Patient called in regard to his results, he's stated that he was speaking to someone about the labs however, the call was dropped and he was returning the call, called CAL spoke with Ms. Kriste she was able to located someone to assist the patient and advise me to transfer the patient over for assistance

## 2023-12-20 NOTE — Progress Notes (Signed)
 Orders Placed This Encounter  Procedures   Ambulatory referral to Sports Medicine    Referral Priority:   Routine    Referral Type:   Consultation    Number of Visits Requested:   1

## 2023-12-26 ENCOUNTER — Encounter: Payer: Self-pay | Admitting: Sports Medicine

## 2023-12-26 ENCOUNTER — Ambulatory Visit: Admitting: Sports Medicine

## 2023-12-26 DIAGNOSIS — M5416 Radiculopathy, lumbar region: Secondary | ICD-10-CM

## 2023-12-26 NOTE — Progress Notes (Signed)
   Subjective:    Patient ID: Brad Cannon, male    DOB: [birth year], 78 y.o.   MRN: [id number]  HPI chief complaint: Left leg pain  Patient is a very pleasant 78 year old male that presents today with left leg pain.  Pain began about 6 weeks ago without any known trauma.  He did have to drag a heavy duffel bag through the airport and he thinks that may be where this all started.  He describes a tingling sensation in the left thigh.  He occasionally gets pain in the lateral left hip as well.  Pain does not radiate past the knee.  It is worse with sitting and laying down.  Improves with walking and standing.  He was originally placed on naproxen  sodium which was not helpful.  This was then changed to a prednisone  taper which helped quite a bit.  Today his discomfort is a 1-2/10.  He was recently seen by his primary care physician.  X-rays were ordered of the left knee and the lumbar spine.  Left knee x-rays are unremarkable.  Lumbar spine x-rays show multilevel disc height loss most notably at L4-L5 and L5-S1.  Nothing acute.  He is very active and enjoys swimming and body pump classes.  Past medical history reviewed Medications reviewed Allergies reviewed  Review of Systems As above    Objective:   Physical Exam  Well-developed, fit appearing.  No acute distress  Lumbar spine: Good lumbar range of motion.  No spasm.  No tenderness to palpation.  Left hip: Smooth painless hip range of motion with a negative logroll  Left knee: Full range of motion.  No effusion.  No tenderness to palpation.  Good ligamentous stability.  Neurological exam: Strength is 5/5 in both lower extremities.  Reflexes are brisk and equal at the Achilles and patellar tendons bilaterally.  Sensation is intact to light touch.  X-rays of the left knee and lumbar spine are as above      Assessment & Plan:   Left leg tingling secondary to bulging lumbar disc  History fits a bulging lumbar disc.  It is likely  at the L2-L3 level since his symptoms do not radiate past the knee.  Fortunately, the prednisone  taper was successful and his pain is minimal at this time.  We had a long discussion about which activities in the gym to avoid.  I provided him with some McKenzie home exercises to start.  If symptoms do not continue to improve then consider formal physical therapy.  We could also repeat a prednisone  taper if needed.  Follow-up for ongoing or recalcitrant issues.  This note was dictated using Dragon naturally speaking software and may contain errors in syntax, spelling, or content which have not been identified prior to signing this note.

## 2024-01-21 ENCOUNTER — Encounter: Payer: Self-pay | Admitting: Family Medicine

## 2024-01-21 ENCOUNTER — Ambulatory Visit: Admitting: Family Medicine

## 2024-01-21 VITALS — BP 120/68 | HR 61 | Ht 65.0 in | Wt 154.0 lb

## 2024-01-21 DIAGNOSIS — H938X3 Other specified disorders of ear, bilateral: Secondary | ICD-10-CM

## 2024-01-21 DIAGNOSIS — L603 Nail dystrophy: Secondary | ICD-10-CM

## 2024-01-21 DIAGNOSIS — W450XXA Nail entering through skin, initial encounter: Secondary | ICD-10-CM

## 2024-01-21 NOTE — Patient Instructions (Signed)
 Salt soak for about 10 min a day for next week. Ok to loosely cover if needed for the next couple of days.

## 2024-01-21 NOTE — Progress Notes (Addendum)
   Acute Office Visit  Subjective:     Patient ID: Brad Cannon, male    DOB: 02-16-1946, 78 y.o.   MRN: [id number]  No chief complaint on file.   HPI Patient is in today for Nail partially coming off on right big toe. Nail I smore thick. Says has been wearing a sock so it doesn't catch on sock.  He is still swimming for exercise  He says his ears have been full and has been popping.  Wonders if he could have swimmer's ear as he does swim regularly for exercise.    ROS      Objective:    BP 120/68   Pulse 61   Ht 5' 5 (1.651 m)   Wt 154 lb (69.9 kg)   SpO2 98%   BMI 25.63 kg/m    Physical Exam HENT:     Head: Normocephalic.     Right Ear: Tympanic membrane, ear canal and external ear normal.     Left Ear: Tympanic membrane, ear canal and external ear normal.     No results found for any visits on 01/21/24.      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  It may just be some pressure from allergies.  Recommend a trial of either an oral antihistamine or nasal steroid spray he says he has some allergy medicine at home and he will try doing that  Right great toenail partially detached and loose.  We discussed just numbing the area and doing a full removal since half of the nail is already detached from the nailbed he agreed to proceed for comfort reasons and because he is worried that it will come off.  He exercises regularly.  Nail Removal  Date/Time: 01/21/2024 4:16 PM  Performed by: Alvan Dorothyann BIRCH, MD Authorized by: Alvan Dorothyann BIRCH, MD   Consent:    Consent obtained:  Verbal   Consent given by:  Patient   Risks, benefits, and alternatives were discussed: yes     Risks discussed:  Pain and  infection Location:    Foot:  R big toe Pre-procedure details:    Skin preparation:  Chlorhexidine Anesthesia:    Anesthesia method:  Local infiltration   Local anesthetic:  Lidocaine 1% w/o epi Nail Removal:    Nail removed:  Complete   Nail bed repaired: no   Post-procedure details:    Dressing:  4x4 sterile gauze   Procedure completion:  Tolerated well, no immediate complications   No orders of the defined types were placed in this encounter.   No follow-ups on file.  Dorothyann Alvan, MD

## 2024-02-05 ENCOUNTER — Ambulatory Visit: Admitting: Family Medicine

## 2024-02-05 ENCOUNTER — Encounter: Payer: Self-pay | Admitting: Family Medicine

## 2024-02-05 VITALS — BP 124/62 | HR 57 | Ht 65.0 in | Wt 153.0 lb

## 2024-02-05 DIAGNOSIS — K2960 Other gastritis without bleeding: Secondary | ICD-10-CM | POA: Diagnosis not present

## 2024-02-05 DIAGNOSIS — R1013 Epigastric pain: Secondary | ICD-10-CM

## 2024-02-05 MED ORDER — PANTOPRAZOLE SODIUM 40 MG PO TBEC: 40.0000 mg | DELAYED_RELEASE_TABLET | Freq: Every day | ORAL | 3 refills | Status: AC

## 2024-02-05 NOTE — Progress Notes (Signed)
 Acute Office Visit  Patient ID: Brad Cannon, male    DOB: 10-Feb-1946, 78 y.o.   MRN: [id number]  PCP: Alvan Dorothyann BIRCH, MD  Chief Complaint  Patient presents with   Gastroesophageal Reflux    Pt reports that he has been eating more spicy food and drinking more coffee lately. He questions if Bananas and working out could have also contributed to the reflux  He has been taking a lot of Tums for this    Subjective:     HPI  Discussed the use of AI scribe software for clinical note transcription with the patient, who gave verbal consent to proceed.  History of Present Illness Brad Cannon is a 78 year old male who presents with worsening indigestion and reflux symptoms.  Gastroesophageal reflux and indigestion symptoms - Worsening indigestion and reflux symptoms over the past couple of weeks - Symptoms coincide with increased decaffeinated coffee consumption from [employer] (2-3 cups daily, sometimes bringing an extra cup home) - Onset associated with dietary changes around his birthday, including chili and a chicken dish (not particularly spicy) - Burning sensation and food regurgitation, particularly at night, causing nocturnal awakenings - Band-like pressure in the epigastric region, sometimes described as 'lifting him up' - No sensation of food getting stuck in the esophagus - Frequent use of Tums for symptom relief, which provides temporary improvement - No use of other acid-suppressing medications such as Prilosec, Nexium, or Prevacid - No chest pain, shortness of breath, or palpitations  Nocturnal symptoms and sleep disruption - Wakes up at night due to reflux symptoms and takes Tums to return to sleep - Occasionally wakes up again later in the night due to symptoms - A few nights with severe pain, raising concern for possible cardiac etiology, but symptoms resolved after sitting down and taking Tums  Dietary triggers and food intolerance - Recent increase  in orange consumption - Bananas have caused excessive belching over the past six months, a new development   ROS     Objective:    BP 124/62   Pulse (!) 57   Ht 5' 5 (1.651 m)   Wt 153 lb (69.4 kg)   SpO2 98%   BMI 25.46 kg/m    Physical Exam Vitals and nursing note reviewed.  Constitutional:      Appearance: Normal appearance.  HENT:     Head: Normocephalic and atraumatic.  Eyes:     Conjunctiva/sclera: Conjunctivae normal.  Cardiovascular:     Rate and Rhythm: Normal rate and regular rhythm.  Pulmonary:     Effort: Pulmonary effort is normal.     Breath sounds: Normal breath sounds.  Abdominal:     General: Bowel sounds are normal.     Palpations: Abdomen is soft.     Tenderness: There is no abdominal tenderness.  Skin:    General: Skin is warm and dry.  Neurological:     Mental Status: He is alert.  Psychiatric:        Mood and Affect: Mood normal.       No results found for any visits on 02/05/24.     Assessment & Plan:   Problem List Items Addressed This Visit       Digestive   Reflux gastritis - Primary   Relevant Orders   H. pylori breath test     Other   Epigastric pain    Assessment and Plan Assessment & Plan Gastroesophageal reflux disease (GERD) Chronic GERD with exacerbated symptoms. Likely  dietary triggers include increased coffee, spicy, and acidic foods. No dysphagia. Differential includes H. pylori infection, liver or pancreatic issues. - Advise dietary modifications: reduce coffee, avoid spicy and acidic foods, limit food intake before bedtime. - Order H. pylori breath test. - Prescribe daily proton pump inhibitor for 4-6 weeks, to be taken 20 minutes before the first meal. - Reassess symptoms in 4-6 weeks. If symptoms persist, consider further evaluation for liver or pancreatic issues. - will check labs if no improvement after one week on the PPI    Meds ordered this encounter  Medications   pantoprazole  (PROTONIX ) 40 MG  tablet    Sig: Take 1 tablet (40 mg total) by mouth daily before breakfast.    Dispense:  30 tablet    Refill:  3    Return if symptoms worsen or fail to improve.  Dorothyann Byars, MD [facility] at [facility]

## 2024-02-07 LAB — H. PYLORI BREATH TEST: H pylori Breath Test: NEGATIVE

## 2024-02-11 ENCOUNTER — Ambulatory Visit: Payer: Self-pay | Admitting: Family Medicine

## 2024-02-11 NOTE — Progress Notes (Signed)
 Call patient: Negative for H. pylori bacteria in the stomach which is good news.  Please see if he is feeling any better?

## 2024-02-19 ENCOUNTER — Telehealth: Payer: Self-pay

## 2024-02-19 NOTE — Telephone Encounter (Signed)
 Patient came into office with a form concerning a form he received in the mail about needing approved referral for his Elastography US  of organ tissue, patient had test done in September, please advise, thanks.

## 2024-02-19 NOTE — Telephone Encounter (Signed)
 I have no idea.  Can you check into this Brad Cannon.  Typically any imaging, the department will let us  know if it needs PA so I am not sure

## 2024-04-18 NOTE — Telephone Encounter (Signed)
 The Prior Authorization for Date of Service: 12/05/2023 for CPT code 23018 with Diagnosis Code R74.01 has been completed and submitted to [facility]. Details are as follows:  Payer Record Number: [id number] Certification Number: [id number]  Message has been submitted to patient via mychart.

## 2024-05-29 ENCOUNTER — Ambulatory Visit: Admitting: Family Medicine

## 2024-06-10 ENCOUNTER — Ambulatory Visit
